# Patient Record
Sex: Male | Born: 1975 | Race: White | Hispanic: No | Marital: Single
Health system: Southern US, Community
[De-identification: ages and names within clinical notes are randomized; demographics above are authoritative.]

## PROBLEM LIST (undated history)

## (undated) DIAGNOSIS — R45851 Suicidal ideations: Secondary | ICD-10-CM

## (undated) DIAGNOSIS — I10 Essential (primary) hypertension: Secondary | ICD-10-CM

## (undated) DIAGNOSIS — F1020 Alcohol dependence, uncomplicated: Secondary | ICD-10-CM

## (undated) DIAGNOSIS — IMO0001 Reserved for inherently not codable concepts without codable children: Secondary | ICD-10-CM

---

## 2006-12-03 DIAGNOSIS — I10 Essential (primary) hypertension: Secondary | ICD-10-CM | POA: Insufficient documentation

## 2006-12-03 HISTORY — DX: Essential (primary) hypertension: I10

## 2007-12-14 ENCOUNTER — Inpatient Hospital Stay (HOSPITAL_COMMUNITY): Admission: AD | Admit: 2007-12-14 | Discharge: 2007-12-17 | Payer: Self-pay | Admitting: Psychiatry

## 2007-12-14 ENCOUNTER — Ambulatory Visit: Payer: Self-pay | Admitting: Psychiatry

## 2007-12-14 ENCOUNTER — Emergency Department (HOSPITAL_COMMUNITY): Admission: EM | Admit: 2007-12-14 | Discharge: 2007-12-14 | Payer: Self-pay | Admitting: Emergency Medicine

## 2010-09-09 ENCOUNTER — Emergency Department (HOSPITAL_COMMUNITY): Admission: EM | Admit: 2010-09-09 | Discharge: 2010-09-09 | Payer: Self-pay | Admitting: Emergency Medicine

## 2011-04-17 NOTE — H&P (Signed)
Steven Bradshaw, Steven Bradshaw NO.:  [id number]   MEDICAL RECORD NO.:  [id number]          PATIENT TYPE:  IPS   LOCATION:                                FACILITY:  [facility]   PHYSICIAN:  Geoffery Lyons, M.D.      DATE OF BIRTH:  1975/12/05   DATE OF ADMISSION:  12/14/2007  DATE OF DISCHARGE:                       PSYCHIATRIC ADMISSION ASSESSMENT   A 35 year old single male voluntarily admitted on December 14, 2007.   HISTORY OF PRESENT ILLNESS:  The patient has been drinking alcohol up to  a case or more daily.  His last drink was on Saturday.  He feels that he  may have had a seizure at home.  He states his sister told him that he  had fallen down.  He feels he had bit his tongue and was foaming at the  mouth.  He states he has also been doing cocaine, has a history of  cocaine since 1998, is able to stay away from drug use for some periods  of time and feels the alcohol is really his major issue.  He denies any  suicidal thoughts.  He has had no prior inpatient stays for detox.   PAST PSYCHIATRIC HISTORY:  First admission to [facility].  No prior admissions.  No current outpatient treatment.   SOCIAL HISTORY:  Is a 35 year old single male.  He has no children.  He  lives alone but states his sister moved in because she was having  economic problems.  He does electrical work.  He did spend 3 days in  jail in 1990 for a DUI.  Denies any current legal or any DUI charges.   FAMILY HISTORY:  Father uses alcohol.   __________ .  The patient smokes.  Again, a questionable history of seizure activity  and recent cocaine use.   PRIMARY CARE Rama Mcclintock:  Has none.   MEDICAL PROBLEMS:  It was reported he had problems with his blood  pressure, has not been on any medications and has no current medications  prior to admission.   DRUG ALLERGIES:  No known allergies.   PHYSICAL EXAM:  The patient was fully assessed at [facility] Emergency  Department.  His temperature is  97.6, heart rate 77, respirations 20,  blood pressure is 156/99, 70 inches tall.  He is 242 pounds.   His ALT is 104, AST is 48.  CBC within normal limits.  Potassium 3.4.  Alcohol level was 218.   Urine drug screen was negative.   He presents today cooperative and fully alert.  He does look flushed,  complaining of some itching.   MENTAL STATUS EXAMINATION:  Fully alert, cooperative, casually dressed,  fair eye contact.  Speech is normal pace and tone.  The patient's mood  is neutral.  The patient's affect is somewhat flat.  Thought process are  coherent.  No evidence of any thought disorder.  Cognition __________  intact.  His memory is good.  Judgment and insight are good.  Poor  impulse control.   AXIS I:  Alcohol dependence, cocaine abuse.  AXIS II:  Deferred.  AXIS III:  Hypertension.  AXIS IV:  Deferred at this time.  AXIS V:  Is 40.   PLAN:  Detox the patient with Librium protocol.  Will put patient on  seizure precautions.  We will work on relapse.  The patient may benefit  from Campral or Revia.  Case manager to assess his rehab and followup.  Will also assess comorbidities.  The patient is to follow up with a  primary care in regards to his history of hypertension.  His tentative  length of stay is 4-6 days.      Landry Corporal, N.P.      Geoffery Lyons, M.D.  Electronically Signed    JO/MEDQ  D:  12/15/2007  T:  12/16/2007  Job:  [id number]

## 2011-04-20 NOTE — Discharge Summary (Signed)
NAMEBRISTOL, SOY NO.:  [id number]   MEDICAL RECORD NO.:  [id number]          PATIENT TYPE:  IPS   LOCATION:  0507                          FACILITY:  [facility]   PHYSICIAN:  Geoffery Lyons, M.D.      DATE OF BIRTH:  [birth year]   DATE OF ADMISSION:  12/14/2007  DATE OF DISCHARGE:  12/17/2007                               DISCHARGE SUMMARY   CHIEF COMPLAINT AND PRESENT ILLNESS:  This was the first admission to  [facility] for this 35 year old male voluntarily  admitted.  Drinking alcohol up to a case or more daily.  Last drink was  on Saturday.  He feels that he might have had a seizure at home.  His  sister told him that he had fallen down.  He had bitten his tongue and  was foaming at the mouth.  Has also been doing cocaine.  History of  cocaine since 1998.  Able to stay away from drug use for some period of  time.  He feels that the alcohol is really his major issue.   PAST PSYCHIATRIC HISTORY:  [facility].  No other  treatment.   ALCOHOL AND DRUG HISTORY:  As already stated, persistent use of alcohol,  up to a case or more daily with some evidence of withdrawal as well as  some history of cocaine abuse.   MEDICAL HISTORY:  High blood pressure.   MEDICATIONS:  None.   Physical exam failed to show any acute findings.   LABORATORY WORK:  SGOT 104, SGPT 48.  CBC within normal limits.  Potassium 3.4.  Alcohol level 218.  UDS was negative for substances of  abuse.   MENTAL STATUS EXAM:  A fully alert, cooperative male, casually dressed,  fair eye contact.  Speech was normal in rate, tempo and production.  Mood is anxious.  Affect is constricted.  Thought processes are clear,  coherent and relevant.  No evidence of delusions.  No active suicidal or  homicidal ideas, no hallucinations.  Cognition:  Well preserved.   ADMISSION DIAGNOSES:  AXIS I:  1.  Alcohol dependence.  2.  Cocaine  abuse.  AXIS II:  No diagnosis.  AXIS  III:  Hypertension.  AXIS IV:  Moderate.  AXIS V:  40, highest GAF in the last year 60.   COURSE IN THE HOSPITAL:  He would he was admitted, started in individual  and group psychotherapy.  He was detoxified with Librium.  He was given  trazodone for sleep.  As already stated, a 36 year old male, single, who  endorsed difficulty with abstaining from alcohol.  Endorses that usually  drinks 18-24 beers during the work day, up to 36 if he does not have to  work.  Drinking since 1995.  Longest sober 3 days while in county jail.  Used cocaine when he was younger.  He was able to quit cocaine but he  continued to drink.  What triggered his requesting help is he is going  back on cocaine and having what seems to have been a convulsion.  Has  used benzodiazepines and shares that the benzodiazepines make him feel  violent.  He has not had any previous treatment, but his father went to  [facility] with good results and the family wants him to go there.  He has continued to work on his recovery.  He continued to work on his  recovery, going to meetings and individual sessions.  Endorsed that the  withdrawal was not as bad as he was dreading and January 14 he was in  full contact with reality.  He was committed.  He understood how  important it was for him to abstain.  A bed was available at [facility], so we went ahead and discharged to be admitted to [facility]  for residential treatment of his alcohol dependency and cocaine abuse.   DISCHARGE DIAGNOSES:  AXIS I:  1.  Alcohol dependence2.  Cocaine abuse.  3.  Substance-induced mood disorder.  AXIS II:  No diagnosis.  AXIS III:  No diagnosis.  AXIS IV:  Moderate.  AXIS V:  On discharge, 50.   Discharged on Librium 25 mg one at 1 p.m. and one at 6 p.m. on January  13.  January 14, one twice a day.  January 15, one in the morning and  then discontinue.   Follow up to [facility].      Geoffery Lyons, M.D.  Electronically  Signed     IL/MEDQ  D:  01/04/2008  T:  01/05/2008  Job:  [id number]

## 2011-08-23 LAB — DIFFERENTIAL
Basophils Absolute: 0.1
Lymphocytes Relative: 38
Monocytes Absolute: 0.8
Monocytes Relative: 8
Neutro Abs: 4.4

## 2011-08-23 LAB — HEPATIC FUNCTION PANEL
ALT: 104 — ABNORMAL HIGH
AST: 48 — ABNORMAL HIGH
Albumin: 4.3
Bilirubin, Direct: 0.2

## 2011-08-23 LAB — BASIC METABOLIC PANEL
Calcium: 9.2
GFR calc Af Amer: >60
GFR calc non Af Amer: >60
Sodium: 138

## 2011-08-23 LAB — CBC
Hemoglobin: 16.9
RBC: 4.99
RDW: 11.6

## 2011-08-23 LAB — RAPID URINE DRUG SCREEN, HOSP PERFORMED
Amphetamines: NOT DETECTED
Tetrahydrocannabinol: NOT DETECTED

## 2013-05-03 ENCOUNTER — Encounter (HOSPITAL_COMMUNITY): Payer: Self-pay

## 2013-05-03 ENCOUNTER — Emergency Department (HOSPITAL_COMMUNITY)
Admission: EM | Admit: 2013-05-03 | Discharge: 2013-05-03 | Disposition: A | Discharge status: Home / Self Care | Payer: Self-pay | Attending: Emergency Medicine | Admitting: Emergency Medicine

## 2013-05-03 DIAGNOSIS — R55 Syncope and collapse: Secondary | ICD-10-CM | POA: Insufficient documentation

## 2013-05-03 DIAGNOSIS — L299 Pruritus, unspecified: Secondary | ICD-10-CM | POA: Insufficient documentation

## 2013-05-03 DIAGNOSIS — Y9389 Activity, other specified: Secondary | ICD-10-CM | POA: Insufficient documentation

## 2013-05-03 DIAGNOSIS — R1310 Dysphagia, unspecified: Secondary | ICD-10-CM | POA: Insufficient documentation

## 2013-05-03 DIAGNOSIS — T63461A Toxic effect of venom of wasps, accidental (unintentional), initial encounter: Secondary | ICD-10-CM | POA: Insufficient documentation

## 2013-05-03 DIAGNOSIS — R221 Localized swelling, mass and lump, neck: Secondary | ICD-10-CM | POA: Insufficient documentation

## 2013-05-03 DIAGNOSIS — T6391XA Toxic effect of contact with unspecified venomous animal, accidental (unintentional), initial encounter: Secondary | ICD-10-CM | POA: Insufficient documentation

## 2013-05-03 DIAGNOSIS — R0789 Other chest pain: Secondary | ICD-10-CM | POA: Insufficient documentation

## 2013-05-03 DIAGNOSIS — Y9289 Other specified places as the place of occurrence of the external cause: Secondary | ICD-10-CM | POA: Insufficient documentation

## 2013-05-03 DIAGNOSIS — R6889 Other general symptoms and signs: Secondary | ICD-10-CM | POA: Insufficient documentation

## 2013-05-03 DIAGNOSIS — F17200 Nicotine dependence, unspecified, uncomplicated: Secondary | ICD-10-CM | POA: Insufficient documentation

## 2013-05-03 DIAGNOSIS — R0602 Shortness of breath: Secondary | ICD-10-CM | POA: Insufficient documentation

## 2013-05-03 DIAGNOSIS — R220 Localized swelling, mass and lump, head: Secondary | ICD-10-CM | POA: Insufficient documentation

## 2013-05-03 DIAGNOSIS — H5789 Other specified disorders of eye and adnexa: Secondary | ICD-10-CM | POA: Insufficient documentation

## 2013-05-03 MED ORDER — FAMOTIDINE 40 MG PO TABS: 40.0000 mg | ORAL_TABLET | Freq: Two times a day (BID) | ORAL | Status: DC

## 2013-05-03 MED ORDER — PREDNISONE 20 MG PO TABS: ORAL_TABLET | ORAL | Status: DC

## 2013-05-03 MED ORDER — EPINEPHRINE 0.3 MG/0.3ML IJ SOAJ: 0.3000 mg | INTRAMUSCULAR | Status: DC | PRN

## 2013-05-03 NOTE — ED Notes (Signed)
Pt was stung by a bee approx 40 minutes ago, had immed sob, wheezing, swelling.  Pt given 0.3 mg epi sq, 125mg  solumedrol, 50mg  zantac per ems.  Pt took own 50 mg benadryl at home prior to ems.  Pt speaking complete sentences now, denies sob

## 2013-05-03 NOTE — ED Notes (Signed)
Discharge instructions reviewed with pt, questions answered. Pt verbalized understanding.  

## 2013-05-03 NOTE — ED Provider Notes (Signed)
History  This chart was scribed for Ward Givens, MD by Bennett Scrape, ED Scribe. This patient was seen in room APA02/APA02 and the patient's care was started at 8:54 PM.  CSN: [id number]  Arrival date & time 05/03/13  2023   First MD Initiated Contact with Patient 05/03/13 2054      Chief Complaint  Patient presents with  . Anaphylactic reaction       The history is provided by the patient. No language interpreter was used.    HPI Comments: Steven Bradshaw is a 37 y.o. 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 Pt states that he developed a "burning up" sensation with his neck and lips became swollen, throat swelling, chest tightness, diffuse itching and trouble breathing following shortly after. Pt reports possible LOC which family members confirm and expand upon saying that the pt was "beet red" and he appeared to be "gasping" for air. Pt was given 3 benadryl with mild improvement and reports that he feels back to baseline after receiving 0.3 mg epi, 125 mg solumedrol and 50 mg Zantac administered by EMS en route. Family states that the pt's eyes and lips appear mildly swollen currently. Pt admits that he has experienced milder episodes with insect stings but denies any prior throat swelling. They deny cyanosis and pt denies SOB and CP currently. Family denies any family h/o allergic reactions to insect bites. Pt does not have a h/o chronic medical conditions and denies being on daily medications.  Patient reports about 7 PM he got stung and within 30 seconds he felt like he was burning up. He then had severe swelling all over including his lips, eyes, his neck, arms and in his throat. He states he felt like his throat was closing up and he was having extreme  difficulty breathing. His mother and father state he still has some mild swelling of his eyes and of his lip. While he was unconscious he was struggling to breathe.  Pt denies having a PCP currently  History reviewed. No pertinent past medical history.  History reviewed. No pertinent past surgical history.  No family history on file.  History  Substance Use Topics  . Smoking status: Current Every Day Smoker  . Smokeless tobacco: Not on file  . Alcohol Use: Yes      Review of Systems  Constitutional: Negative for fever and chills.  HENT: Positive for facial swelling and trouble swallowing.   Respiratory: Positive for chest tightness and shortness of breath. Negative for cough.   Gastrointestinal: Negative for nausea and vomiting.  Neurological: Positive for syncope. Negative for dizziness and light-headedness.  All other systems reviewed and are negative.    Allergies  Bee venom  Home Medications   Current Outpatient Rx  Name  Route  Sig  Dispense  Refill  . diphenhydrAMINE (BENADRYL) 25 mg capsule   Oral   Take 25 mg by mouth every 6 (six) hours as needed for itching or allergies.           Triage Vitals: BP 157/81  Pulse 94  Temp(Src) 97.5 F (36.4 C) (Oral)  Resp 24  Ht 5\' 10"  (1.778 m)  Wt 230 lb (104.327 kg)  BMI 33 kg/m2  SpO2 99%  Vital signs normal  Physical Exam  Nursing note and vitals reviewed. Constitutional: He is oriented to person, place, and time. He appears well-developed and well-nourished.  Non-toxic appearance. He does not appear ill. No distress.  HENT:  Head: Atraumatic.  Right Ear: External ear normal.  Left Ear: External ear normal.  Nose: Nose normal. No mucosal edema or rhinorrhea.  Mouth/Throat: Oropharynx is clear and moist and mucous membranes are normal. No dental abscesses or edematous.  Mild swelling of eyelids  Eyes: Conjunctivae and EOM are normal. Pupils are equal, round, and reactive to light.  Neck: Normal range  of motion and full passive range of motion without pain. Neck supple.  Cardiovascular: Normal rate, regular rhythm and normal heart sounds.  Exam reveals no gallop and no friction rub.   No murmur heard. Pulmonary/Chest: Effort normal and breath sounds normal. No respiratory distress. He has no wheezes. He has no rhonchi. He has no rales. He exhibits no tenderness and no crepitus.  Abdominal: Soft. Normal appearance and bowel sounds are normal. He exhibits no distension. There is no tenderness. There is no rebound and no guarding.  Musculoskeletal: Normal range of motion. He exhibits no edema and no tenderness.  Moves all extremities well.   Neurological: He is alert and oriented to person, place, and time. He has normal strength. No cranial nerve deficit.  Skin: Skin is warm, dry and intact. No rash noted. No erythema. No pallor.  Psychiatric: He has a normal mood and affect. His speech is normal and behavior is normal. His mood appears not anxious.    ED Course  Procedures (including critical care time)  DIAGNOSTIC STUDIES: Oxygen Saturation is 99% on room air, normal by my interpretation.    COORDINATION OF CARE: 9:11 PM-Discussed treatment plan which includes observation with pt at bedside and pt agreed to plan. Advised pt that he needs to carry an epi pen and call an exterminator.  10:22 PM-Pt rechecked and feels improved with medications listed above. Family states that the swelling has improved in the ED.Marland Kitchen Discussed discharge plan. Addressed symptoms to return for with pt.  Patient strongly advised to have somebody else check for any other bee or wasp next where he works and lives. He is also strongly advised to always keep an EpiPen handy.   1. Bee sting-induced anaphylaxis, initial encounter     Plan discharge  Discharge Medication List as of 05/03/2013 10:37 PM    START taking these medications   Details  EPINEPHrine (EPIPEN) 0.3 mg/0.3 mL DEVI Inject 0.3 mLs (0.3 mg total)  into the muscle as needed., Starting 05/03/2013, Until Discontinued, Print    famotidine (PEPCID) 40 MG tablet Take 1 tablet (40 mg total) by mouth 2 (two) times daily., Starting 05/03/2013, Until Discontinued, Print    predniSONE (DELTASONE) 20 MG tablet Take 3 po QD x 2d starting tomorrow, then 2 po QD x 3d then 1 po QD x 3d, Print         Devoria Albe, MD, FACEP   MDM    I personally performed the services described in this documentation, which was scribed in my presence. The recorded information has been reviewed and considered. Devoria Albe, MD, Armando Gang    Ward Givens, MD 05/03/13 [phone]

## 2013-05-03 NOTE — ED Notes (Signed)
Pt placed on monitor and 2L O2 [place], pt still feels like his face and lips are swollen but is able to breathe/swallow at this time, pt has rash on abdomen, arms and legs bilaterally.

## 2013-05-03 NOTE — ED Notes (Signed)
Pt alert & oriented x4, stable gait. Patient given discharge instructions, paperwork & prescription(s). Patient  instructed to stop at the registration desk to finish any additional paperwork. Patient verbalized understanding. Pt left department w/ no further questions. 

## 2013-06-28 LAB — ACETAMINOPHEN LEVEL: Acetaminophen: <2 ug/mL

## 2013-06-28 LAB — DRUG SCREEN, URINE
Barbiturates, Ur Screen: NEGATIVE (ref ?–200)
Benzodiazepine, Ur Scrn: NEGATIVE (ref ?–200)
Methadone, Ur Screen: NEGATIVE (ref ?–300)
Opiate, Ur Screen: NEGATIVE (ref ?–300)

## 2013-06-28 LAB — COMPREHENSIVE METABOLIC PANEL
Albumin: 3.9 g/dL (ref 3.4–5.0)
Alkaline Phosphatase: 115 U/L (ref 50–136)
Anion Gap: 12 (ref 7–16)
Chloride: 105 mmol/L (ref 98–107)
EGFR (Non-African Amer.): >60
SGOT(AST): 21 U/L (ref 15–37)
SGPT (ALT): 32 U/L (ref 12–78)
Total Protein: 7.8 g/dL (ref 6.4–8.2)

## 2013-06-28 LAB — CBC
HGB: 15.9 g/dL (ref 13.0–18.0)
RBC: 4.75 10*6/uL (ref 4.40–5.90)
WBC: 11.1 10*3/uL — ABNORMAL HIGH (ref 3.8–10.6)

## 2013-06-28 LAB — SALICYLATE LEVEL: Salicylates, Serum: 1.7 mg/dL

## 2013-06-28 LAB — ETHANOL: Ethanol %: 0.268 % — ABNORMAL HIGH (ref 0.000–0.080)

## 2013-06-29 ENCOUNTER — Inpatient Hospital Stay: Payer: Self-pay | Admitting: Psychiatry

## 2015-03-24 ENCOUNTER — Emergency Department (HOSPITAL_COMMUNITY)
Admission: EM | Admit: 2015-03-24 | Discharge: 2015-03-24 | Discharge status: Against Medical Advice | Payer: BLUE CROSS/BLUE SHIELD | Attending: Emergency Medicine | Admitting: Emergency Medicine

## 2015-03-24 ENCOUNTER — Encounter (HOSPITAL_COMMUNITY): Payer: Self-pay

## 2015-03-24 ENCOUNTER — Emergency Department (HOSPITAL_COMMUNITY): Payer: BLUE CROSS/BLUE SHIELD

## 2015-03-24 DIAGNOSIS — Z720 Tobacco use: Secondary | ICD-10-CM | POA: Diagnosis not present

## 2015-03-24 DIAGNOSIS — R079 Chest pain, unspecified: Secondary | ICD-10-CM

## 2015-03-24 DIAGNOSIS — M549 Dorsalgia, unspecified: Secondary | ICD-10-CM | POA: Insufficient documentation

## 2015-03-24 DIAGNOSIS — R0602 Shortness of breath: Secondary | ICD-10-CM | POA: Diagnosis not present

## 2015-03-24 DIAGNOSIS — Z79899 Other long term (current) drug therapy: Secondary | ICD-10-CM | POA: Insufficient documentation

## 2015-03-24 LAB — CBC WITH DIFFERENTIAL/PLATELET
Basophils Absolute: 0.1 K/uL (ref 0.0–0.1)
Basophils Relative: 1 % (ref 0–1)
Eosinophils Absolute: 0.4 K/uL (ref 0.0–0.7)
Eosinophils Relative: 6 % — ABNORMAL HIGH (ref 0–5)
HCT: 47.1 % (ref 39.0–52.0)
Hemoglobin: 17.2 g/dL — ABNORMAL HIGH (ref 13.0–17.0)
Lymphocytes Relative: 37 % (ref 12–46)
Lymphs Abs: 2.6 K/uL (ref 0.7–4.0)
MCH: 32 pg (ref 26.0–34.0)
MCHC: 36.5 g/dL — ABNORMAL HIGH (ref 30.0–36.0)
MCV: 87.7 fL (ref 78.0–100.0)
Monocytes Absolute: 1 K/uL (ref 0.1–1.0)
Monocytes Relative: 14 % — ABNORMAL HIGH (ref 3–12)
Neutro Abs: 2.9 K/uL (ref 1.7–7.7)
Neutrophils Relative %: 42 % — ABNORMAL LOW (ref 43–77)
Platelets: 235 K/uL (ref 150–400)
RBC: 5.37 MIL/uL (ref 4.22–5.81)
RDW: 11.4 % — ABNORMAL LOW (ref 11.5–15.5)
WBC: 6.9 K/uL (ref 4.0–10.5)

## 2015-03-24 LAB — BASIC METABOLIC PANEL
Anion gap: 9 (ref 5–15)
BUN: 13 mg/dL (ref 6–23)
CHLORIDE: 106 mmol/L (ref 96–112)
CO2: 25 mmol/L (ref 19–32)
Calcium: 9.6 mg/dL (ref 8.4–10.5)
Creatinine, Ser: 0.65 mg/dL (ref 0.50–1.35)
GFR calc Af Amer: >90 mL/min (ref >90)
GLUCOSE: 113 mg/dL — AB (ref 70–99)
POTASSIUM: 4.2 mmol/L (ref 3.5–5.1)
Sodium: 140 mmol/L (ref 135–145)

## 2015-03-24 LAB — TROPONIN I: Troponin I: <0.03 ng/mL

## 2015-03-24 NOTE — ED Notes (Signed)
Pt reports pt has had chest pain that started Sunday afternoon and lasted until Wednesday.  Describes as house sitting on chest.  Reports to PCP today and told them chest pain was better but still had some tightness.  PCP gave 1 nitro and tightness went away.  Reports had ekg in office and sent here for further exam.  Also reports htn, not on any medication.

## 2015-03-24 NOTE — ED Provider Notes (Signed)
CSN: [id number]     Arrival date & time 03/24/15  1053 History  This chart was scribed for Vanetta Mulders, MD by Elveria Rising, ED scribe.  This patient was seen in room APA17/APA17 and the patient's care was started at 12:14 PM.   Chief Complaint  Patient presents with  . Chest Pain   Patient is a 39 y.o. male presenting with chest pain. The history is provided by the patient. No language interpreter was used.  Chest Pain Pain quality: pressure and tightness   Pain radiates to:  Does not radiate Pain radiates to the back: no   Pain severity:  Moderate Onset quality:  Sudden Duration:  5 days Timing:  Constant Progression:  Improving Context: at rest   Relieved by:  Nitroglycerin Associated symptoms: back pain and shortness of breath   Associated symptoms: no abdominal pain, no cough, no fever, no headache, no nausea and not vomiting   Risk factors: hypertension    HPI Comments: Steven Bradshaw is a 39 y.o. male who presents to the Emergency Department as directed by doctor at clinic in town for further evaluation. Patient visited the office this morning for evaluation of constant left chest pain described as tightness and pressure with associated diaphoresis and shortness of breath ongoing for four days. Patient reports pain rated at 8/10 at onset that decreased to 5/10 for the duration of his symptoms. Patient reports resolution of pain yesterday and return of mild chest tightness this morning which was improved in the office today with 1 nitroglycerin and aspirin. EKG performed. Patient denies history of similar chest pain. Patietn reports treatment with aspirin one day following onset of his symptoms, without improvement.   History reviewed. No pertinent past medical history. History reviewed. No pertinent past surgical history. No family history on file. History  Substance Use Topics  . Smoking status: Current Every Day Smoker  . Smokeless tobacco: Not on file  . Alcohol Use:  Yes     Comment: daily, heavily- last drank etoh last night.    Review of Systems  Constitutional: Negative for fever and chills.  HENT: Negative for rhinorrhea and sore throat.   Eyes: Negative for visual disturbance.  Respiratory: Positive for shortness of breath. Negative for cough.   Cardiovascular: Positive for chest pain. Negative for leg swelling.  Gastrointestinal: Negative for nausea, vomiting and abdominal pain.  Genitourinary: Negative for dysuria and hematuria.  Musculoskeletal: Positive for back pain.  Skin: Negative for rash.  Neurological: Negative for headaches.  Hematological: Does not bruise/bleed easily.      Allergies  Bee venom  Home Medications   Prior to Admission medications   Medication Sig Start Date End Date Taking? Authorizing Provider  diphenhydrAMINE (BENADRYL) 25 mg capsule Take 25 mg by mouth every 6 (six) hours as needed for itching or allergies.   Yes Historical Provider, MD  EPINEPHrine (EPIPEN) 0.3 mg/0.3 mL DEVI Inject 0.3 mLs (0.3 mg total) into the muscle as needed. 05/03/13  Yes Devoria Albe, MD  loratadine (CLARITIN) 10 MG tablet Take 10 mg by mouth daily as needed for allergies.   Yes Historical Provider, MD  famotidine (PEPCID) 40 MG tablet Take 1 tablet (40 mg total) by mouth 2 (two) times daily. Patient not taking: Reported on 03/24/2015 05/03/13   Devoria Albe, MD  predniSONE (DELTASONE) 20 MG tablet Take 3 po QD x 2d starting tomorrow, then 2 po QD x 3d then 1 po QD x 3d Patient not taking: Reported on 03/24/2015  05/03/13   Devoria AlbeIva Knapp, MD   Triage Vitals: BP 162/100 mmHg  Pulse 97  Temp(Src) 98.4 F (36.9 C) (Oral)  Resp 18  SpO2 97% Physical Exam  Constitutional: He is oriented to person, place, and time. He appears well-developed and well-nourished. No distress.  HENT:  Head: Normocephalic and atraumatic.  Eyes: Conjunctivae and EOM are normal. Pupils are equal, round, and reactive to light.  Neck: Normal range of motion.   Cardiovascular: Normal rate, regular rhythm and normal heart sounds.   No murmur heard. Pulmonary/Chest: Effort normal and breath sounds normal.  Abdominal: Soft. Bowel sounds are normal. There is no tenderness.  Musculoskeletal: Normal range of motion.  Neurological: He is alert and oriented to person, place, and time. No cranial nerve deficit. He exhibits normal muscle tone. Coordination normal.  Skin: Skin is warm. No rash noted.  Nursing note and vitals reviewed.   ED Course  Procedures (including critical care time)  COORDINATION OF CARE: 12:14 PM- Patient informed of protocol for admission and observation. Patient states "No, I have to go. I have things to do." Patient informed that his leaving is against medical advise, but he maintains his decision.  Discussed treatment plan with patient at bedside and patient agreed to plan.   Labs Review Labs Reviewed  CBC WITH DIFFERENTIAL/PLATELET - Abnormal; Notable for the following:    Hemoglobin 17.2 (*)    MCHC 36.5 (*)    RDW 11.4 (*)    Neutrophils Relative % 42 (*)    Monocytes Relative 14 (*)    Eosinophils Relative 6 (*)    All other components within normal limits  BASIC METABOLIC PANEL - Abnormal; Notable for the following:    Glucose, Bld 113 (*)    All other components within normal limits  TROPONIN I   Results for orders placed or performed during the hospital encounter of 03/24/15  CBC with Differential  Result Value Ref Range   WBC 6.9 4.0 - 10.5 K/uL   RBC 5.37 4.22 - 5.81 MIL/uL   Hemoglobin 17.2 (H) 13.0 - 17.0 g/dL   HCT 16.147.1 09.639.0 - 04.552.0 %   MCV 87.7 78.0 - 100.0 fL   MCH 32.0 26.0 - 34.0 pg   MCHC 36.5 (H) 30.0 - 36.0 g/dL   RDW 40.911.4 (L) 81.111.5 - 91.415.5 %   Platelets 235 150 - 400 K/uL   Neutrophils Relative % 42 (L) 43 - 77 %   Neutro Abs 2.9 1.7 - 7.7 K/uL   Lymphocytes Relative 37 12 - 46 %   Lymphs Abs 2.6 0.7 - 4.0 K/uL   Monocytes Relative 14 (H) 3 - 12 %   Monocytes Absolute 1.0 0.1 - 1.0 K/uL    Eosinophils Relative 6 (H) 0 - 5 %   Eosinophils Absolute 0.4 0.0 - 0.7 K/uL   Basophils Relative 1 0 - 1 %   Basophils Absolute 0.1 0.0 - 0.1 K/uL  Basic metabolic panel  Result Value Ref Range   Sodium 140 135 - 145 mmol/L   Potassium 4.2 3.5 - 5.1 mmol/L   Chloride 106 96 - 112 mmol/L   CO2 25 19 - 32 mmol/L   Glucose, Bld 113 (H) 70 - 99 mg/dL   BUN 13 6 - 23 mg/dL   Creatinine, Ser 7.820.65 0.50 - 1.35 mg/dL   Calcium 9.6 8.4 - 95.610.5 mg/dL   GFR calc non Af Amer >90 >90 mL/min   GFR calc Af Amer >90 >90 mL/min  Anion gap 9 5 - 15  Troponin I  Result Value Ref Range   Troponin I <0.03 <0.031 ng/mL     Imaging Review Dg Chest Portable 1 View  03/24/2015   CLINICAL DATA:  Acute chest pain.  EXAM: PORTABLE CHEST - 1 VIEW  COMPARISON:  December 14, 2007.  FINDINGS: The heart size and mediastinal contours are within normal limits. Both lungs are clear. No pneumothorax or pleural effusion is noted. The visualized skeletal structures are unremarkable.  IMPRESSION: No acute cardiopulmonary abnormality seen.   Electronically Signed   By: Lupita Raider, M.D.   On: 03/24/2015 11:37     EKG Interpretation   Date/Time:  Thursday March 24 2015 11:03:13 EDT Ventricular Rate:  101 PR Interval:  133 QRS Duration: 92 QT Interval:  324 QTC Calculation: 420 R Axis:   66 Text Interpretation:  Sinus tachycardia Probable left atrial enlargement  Baseline wander in lead(s) V1 no previousl  artifact Confirmed by  Gracie Gupta  MD, Nathalee Smarr ([id number]) on 03/24/2015 11:07:53 AM      MDM   Final diagnoses:  Chest pain, unspecified chest pain type    patient with concerning history for cardiac type chest pain. Patient with pain since Sunday but still had pain this morning went away with the nitroglycerin and aspirin at outlying doctor's office. Patient refuses admission. Was able to reassure patient that probably did not have a cardiac heart attack yesterday or earlier in the week but we cannot say that  this is not a warning sign and cannot say what happened today. Recommended admission for cardiac rule out. Patient refuses but understands the risks to include death.    workup otherwise negative chest x-ray negative for pneumonia or pulmonary edema. No significant lab abnormalities. First troponin negative as stated. Patient states he will at least follow-up with cardiology and continue an aspirin a day.  I personally performed the services described in this documentation, which was scribed in my presence. The recorded information has been reviewed and is accurate.   Vanetta Mulders, MD 03/24/15 1319

## 2015-03-24 NOTE — ED Notes (Signed)
MD states pt is going to leave AMA. Pt is refusing to be admitted.

## 2015-03-24 NOTE — Discharge Instructions (Signed)
As stated we recommend admission. Return for any new or worse symptoms. At least follow-up with cardiology. Continue take a baby aspirin a day. Return for any recurrent chest pain at all. He can return at any point in time if you change your mind.

## 2015-03-24 NOTE — ED Notes (Signed)
MD at bedside. 

## 2015-03-25 NOTE — H&P (Signed)
PATIENT NAME:  Steven Bradshaw, Steven Bradshaw MR#:  [id number] DATE OF BIRTH:  09-14-1976  DATE OF ADMISSION:  06/29/2013  REFERRING PHYSICIAN: Emergency Room M.D.   ATTENDING PHYSICIAN: Keeya Dyckman B. Jennet Maduro, M.D.   IDENTIFYING DATA: Mr. Luczak is a 39 year old male with a history of alcoholism.   CHIEF COMPLAINT: "I need treatment."   HISTORY OF PRESENT ILLNESS: Mr. Barrack has been drinking since the age of 1. He lives with his mother and father now and they do not allow him to drink at home. However this weekend they went out of town and he got drunk. Usually he drinks after work in the evening and drinks until he passes out. He got so sick this weekend that he decided to come for alcohol treatment. He denies any symptoms of depression or psychosis. There are no symptoms of bipolar mania. He denies other than alcohol substance use. He does anxious, especially in a social setting. He tried to go to AA in the past but it has been very difficult for him to put up with the crowd.   PAST PSYCHIATRIC HISTORY: As above, he started drinking at 23. In 2008 he went to [facility] for a 30-day program. He did not maintain any sobriety after. He went to 1 AA meeting. He explains that at that time it was his family telling him what to do, and he was not committed. He feels that he has the resolve now.   FAMILY PSYCHIATRIC HISTORY: Multiple family members with alcoholism.   PAST MEDICAL HISTORY: None.   ALLERGIES: No known drug allergies.   MEDICATIONS ON ADMISSION: None.   SOCIAL HISTORY: He works as an Personnel officer. Twice already he quit to his job at the same company after 10 years of working there due to alcohol. He is a Marketing executive and always welcome back. He has never been married, and has no children. He currently lives with his mother and father.   REVIEW OF SYSTEMS:  CONSTITUTIONAL: No fevers or chills. No weight changes.  EYES: No double or blurred vision.  ENT: No hearing loss.  RESPIRATORY:  No shortness of breath or cough.  CARDIOVASCULAR: No chest pain or orthopnea.  GASTROINTESTINAL: No abdominal pain, nausea, vomiting, or diarrhea.  GENITOURINARY: No incontinence or frequency.  ENDOCRINE: No heat or cold intolerance.  LYMPHATIC: No anemia or easy bruising.  INTEGUMENTARY: No acne or rash.  MUSCULOSKELETAL: No muscle or joint pain.  NEUROLOGIC: No tingling or weakness.  PSYCHIATRIC: See history of present illness for details.   PHYSICAL EXAMINATION: VITAL SIGNS: Blood pressure 145/87, pulse 96, respirations 20, temperature 98.1.  GENERAL: This is a slightly obese male in no acute distress.  HEENT: The pupils are equal, round, and reactive to light. Sclerae are anicteric.  NECK: Supple. No thyromegaly.  LUNGS: Clear to auscultation. No dullness to percussion.  HEART: Regular rhythm and rate. No murmurs, rubs, or gallops.  ABDOMEN: Soft, nontender, nondistended. Positive bowel sounds.  MUSCULOSKELETAL: Normal muscle strength in all extremities.  SKIN: No rashes or bruises.  LYMPHATIC: No cervical adenopathy.  NEUROLOGIC: Cranial nerves II-XII are intact.   LABORATORY DATA: Chemistries are within normal limits.   Blood alcohol level is 0.268.   LFTs are within normal limits. TSH 1.66.   Urine tox screen negative for substances.   CBC within normal limits except for white blood count of 11.1. Serum acetaminophen and salicylates are low.   MENTAL STATUS EXAMINATION: On admission the patient is alert and oriented to person, place, time, and  situation. He is pleasant, polite, and cooperative. He is well-groomed and casually dressed. He maintains good eye contact. His speech is of normal rhythm, rate and volume. Mood is fine, with flat affect. Thought processing is logical and goal-oriented. Thought content: The patient denies suicidal or homicidal ideations. There are no delusions or paranoia. There are no auditory or visual hallucinations. His cognition is grossly intact.  He registers 3 out of 3 and recalls 3 out of 3 objects after 5 minutes. He can spell "world" forwards and backwards. He knows the current Economistresident. His insight and judgment are questionable.   Suicide risk assessment on admission: This is a patient with a lifelong history of alcoholism and mild anxiety, but not depression or suicidal ideation, who came to the hospital asking for alcohol treatment.   INITIAL DIAGNOSES:   AXIS I: Alcohol dependence.   AXIS II: Deferred.   AXIS III: Deferred.   AXIS IV: Substance abuse, family conflict, legal.   AXIS V: Global Assessment Of Functioning: On admission 35.   PLAN: The patient was admitted to the [facility] Unit for safety, stabilization and medication management. He was initially placed on suicide precautions and was closely monitored for any unsafe behaviors. He underwent full psychiatric and risk assessment. He received pharmacotherapy, individual and group psychotherapy, substance abuse counseling, and support from therapeutic milieu.   1.  Alcohol detox: He is placed on the CIWA protocol. Will be monitored for symptoms of alcohol withdrawal.  2.  Substance abuse treatment: The patient has private insurance. We will contact his insurance company to see what they recommendation.   DISPOSITION: He will be discharged to home.    ____________________________ Ellin GoodieJolanta B. Jaysion Ramseyer, MD jbp:dm D: 06/29/2013 13:55:00 ET T: 06/29/2013 14:52:17 ET JOB#: [id number]  cc: Dorris Pierre B. Jennet MaduroPucilowska, MD, <Dictator> Shari ProwsJOLANTA B Cordelle Dahmen MD ELECTRONICALLY SIGNED 06/29/2013 21:41

## 2015-04-03 DIAGNOSIS — IMO0001 Reserved for inherently not codable concepts without codable children: Secondary | ICD-10-CM

## 2015-04-03 HISTORY — DX: Reserved for inherently not codable concepts without codable children: IMO0001

## 2015-04-14 ENCOUNTER — Observation Stay (HOSPITAL_COMMUNITY): Payer: BLUE CROSS/BLUE SHIELD

## 2015-04-14 ENCOUNTER — Observation Stay (HOSPITAL_BASED_OUTPATIENT_CLINIC_OR_DEPARTMENT_OTHER): Payer: BLUE CROSS/BLUE SHIELD

## 2015-04-14 ENCOUNTER — Observation Stay (HOSPITAL_COMMUNITY)
Admission: EM | Admit: 2015-04-14 | Discharge: 2015-04-15 | Disposition: A | Discharge status: Home / Self Care | Payer: BLUE CROSS/BLUE SHIELD | Attending: Internal Medicine | Admitting: Internal Medicine

## 2015-04-14 ENCOUNTER — Encounter (HOSPITAL_COMMUNITY): Payer: Self-pay

## 2015-04-14 ENCOUNTER — Other Ambulatory Visit (HOSPITAL_COMMUNITY): Payer: Self-pay

## 2015-04-14 DIAGNOSIS — F17210 Nicotine dependence, cigarettes, uncomplicated: Secondary | ICD-10-CM | POA: Diagnosis not present

## 2015-04-14 DIAGNOSIS — T7840XA Allergy, unspecified, initial encounter: Principal | ICD-10-CM

## 2015-04-14 DIAGNOSIS — R079 Chest pain, unspecified: Secondary | ICD-10-CM | POA: Diagnosis not present

## 2015-04-14 DIAGNOSIS — Z79899 Other long term (current) drug therapy: Secondary | ICD-10-CM | POA: Insufficient documentation

## 2015-04-14 DIAGNOSIS — L509 Urticaria, unspecified: Secondary | ICD-10-CM | POA: Diagnosis present

## 2015-04-14 DIAGNOSIS — Z91030 Bee allergy status: Secondary | ICD-10-CM | POA: Diagnosis not present

## 2015-04-14 DIAGNOSIS — X58XXXA Exposure to other specified factors, initial encounter: Secondary | ICD-10-CM | POA: Diagnosis not present

## 2015-04-14 DIAGNOSIS — R072 Precordial pain: Secondary | ICD-10-CM

## 2015-04-14 DIAGNOSIS — R0602 Shortness of breath: Secondary | ICD-10-CM

## 2015-04-14 DIAGNOSIS — F1010 Alcohol abuse, uncomplicated: Secondary | ICD-10-CM | POA: Diagnosis present

## 2015-04-14 DIAGNOSIS — Z720 Tobacco use: Secondary | ICD-10-CM | POA: Diagnosis present

## 2015-04-14 DIAGNOSIS — I10 Essential (primary) hypertension: Secondary | ICD-10-CM

## 2015-04-14 HISTORY — DX: Essential (primary) hypertension: I10

## 2015-04-14 LAB — TROPONIN I
Troponin I: <0.03 ng/mL (ref <0.031)
Troponin I: <0.03 ng/mL (ref <0.031)
Troponin I: <0.03 ng/mL (ref <0.031)

## 2015-04-14 LAB — RAPID URINE DRUG SCREEN, HOSP PERFORMED
Amphetamines: NOT DETECTED
Barbiturates: NOT DETECTED
Benzodiazepines: NOT DETECTED
Cocaine: NOT DETECTED
OPIATES: NOT DETECTED
TETRAHYDROCANNABINOL: NOT DETECTED

## 2015-04-14 LAB — CBC WITH DIFFERENTIAL/PLATELET
Basophils Absolute: 0 10*3/uL (ref 0.0–0.1)
Basophils Relative: 0 % (ref 0–1)
EOS PCT: 3 % (ref 0–5)
Eosinophils Absolute: 0.2 10*3/uL (ref 0.0–0.7)
HEMATOCRIT: 47.1 % (ref 39.0–52.0)
HEMOGLOBIN: 17.1 g/dL — AB (ref 13.0–17.0)
LYMPHS PCT: 29 % (ref 12–46)
Lymphs Abs: 1.9 10*3/uL (ref 0.7–4.0)
MCH: 31.5 pg (ref 26.0–34.0)
MCHC: 36.3 g/dL — ABNORMAL HIGH (ref 30.0–36.0)
MCV: 86.9 fL (ref 78.0–100.0)
MONO ABS: 0.9 10*3/uL (ref 0.1–1.0)
MONOS PCT: 13 % — AB (ref 3–12)
NEUTROS ABS: 3.7 10*3/uL (ref 1.7–7.7)
Neutrophils Relative %: 55 % (ref 43–77)
Platelets: 225 10*3/uL (ref 150–400)
RBC: 5.42 MIL/uL (ref 4.22–5.81)
RDW: 11.6 % (ref 11.5–15.5)
WBC: 6.7 10*3/uL (ref 4.0–10.5)

## 2015-04-14 LAB — BASIC METABOLIC PANEL
ANION GAP: 5 (ref 5–15)
BUN: 12 mg/dL (ref 6–20)
CALCIUM: 9.6 mg/dL (ref 8.9–10.3)
CHLORIDE: 108 mmol/L (ref 101–111)
CO2: 25 mmol/L (ref 22–32)
CREATININE: 0.7 mg/dL (ref 0.61–1.24)
GFR calc non Af Amer: >60 mL/min (ref >60)
Glucose, Bld: 120 mg/dL — ABNORMAL HIGH (ref 65–99)
Potassium: 4.5 mmol/L (ref 3.5–5.1)
Sodium: 138 mmol/L (ref 135–145)

## 2015-04-14 LAB — CREATININE, SERUM
Creatinine, Ser: 0.66 mg/dL (ref 0.61–1.24)
GFR calc non Af Amer: >60 mL/min (ref >60)

## 2015-04-14 LAB — LIPID PANEL
CHOL/HDL RATIO: 2.8 ratio
Cholesterol: 156 mg/dL (ref 0–200)
HDL: 56 mg/dL (ref >40)
LDL Cholesterol: 93 mg/dL (ref 0–99)
Triglycerides: 37 mg/dL (ref <150)
VLDL: 7 mg/dL (ref 0–40)

## 2015-04-14 LAB — CBC
HEMATOCRIT: 48.8 % (ref 39.0–52.0)
Hemoglobin: 17.9 g/dL — ABNORMAL HIGH (ref 13.0–17.0)
MCH: 31.7 pg (ref 26.0–34.0)
MCHC: 36.7 g/dL — ABNORMAL HIGH (ref 30.0–36.0)
MCV: 86.4 fL (ref 78.0–100.0)
PLATELETS: 244 10*3/uL (ref 150–400)
RBC: 5.65 MIL/uL (ref 4.22–5.81)
RDW: 11.6 % (ref 11.5–15.5)
WBC: 8.9 10*3/uL (ref 4.0–10.5)

## 2015-04-14 LAB — MRSA PCR SCREENING: MRSA by PCR: NEGATIVE

## 2015-04-14 LAB — TSH: TSH: 0.274 u[IU]/mL — ABNORMAL LOW (ref 0.350–4.500)

## 2015-04-14 LAB — D-DIMER, QUANTITATIVE: D-Dimer, Quant: 1.58 ug/mL-FEU — ABNORMAL HIGH (ref 0.00–0.48)

## 2015-04-14 LAB — ETHANOL: Alcohol, Ethyl (B): <5 mg/dL (ref <5)

## 2015-04-14 MED ORDER — REGADENOSON 0.4 MG/5ML IV SOLN
INTRAVENOUS | Status: AC
  Filled 2015-04-14: qty 5

## 2015-04-14 MED ORDER — DIPHENHYDRAMINE HCL 50 MG/ML IJ SOLN
25.0000 mg | Freq: Once | INTRAMUSCULAR | Status: AC
  Administered 2015-04-14: 25 mg via INTRAVENOUS
  Filled 2015-04-14: qty 1

## 2015-04-14 MED ORDER — NICOTINE 14 MG/24HR TD PT24
14.0000 mg | MEDICATED_PATCH | Freq: Every day | TRANSDERMAL | Status: DC
  Administered 2015-04-14 – 2015-04-15 (×2): 14 mg via TRANSDERMAL
  Filled 2015-04-14 (×2): qty 1

## 2015-04-14 MED ORDER — MORPHINE SULFATE 2 MG/ML IJ SOLN: 2.0000 mg | INTRAMUSCULAR | Status: DC | PRN

## 2015-04-14 MED ORDER — SODIUM CHLORIDE 0.9 % IJ SOLN
3.0000 mL | Freq: Two times a day (BID) | INTRAMUSCULAR | Status: DC
  Administered 2015-04-14 – 2015-04-15 (×3): 3 mL via INTRAVENOUS

## 2015-04-14 MED ORDER — VITAMIN B-1 100 MG PO TABS
100.0000 mg | ORAL_TABLET | Freq: Every day | ORAL | Status: DC
  Administered 2015-04-14 – 2015-04-15 (×2): 100 mg via ORAL
  Filled 2015-04-14 (×2): qty 1

## 2015-04-14 MED ORDER — ASPIRIN 81 MG PO CHEW: 324.0000 mg | CHEWABLE_TABLET | Freq: Once | ORAL | Status: DC

## 2015-04-14 MED ORDER — ADULT MULTIVITAMIN W/MINERALS CH
1.0000 | ORAL_TABLET | Freq: Every day | ORAL | Status: DC
  Administered 2015-04-14 – 2015-04-15 (×2): 1 via ORAL
  Filled 2015-04-14 (×2): qty 1

## 2015-04-14 MED ORDER — ACETAMINOPHEN 325 MG PO TABS: 650.0000 mg | ORAL_TABLET | Freq: Four times a day (QID) | ORAL | Status: DC | PRN

## 2015-04-14 MED ORDER — ONDANSETRON HCL 4 MG PO TABS: 4.0000 mg | ORAL_TABLET | Freq: Four times a day (QID) | ORAL | Status: DC | PRN

## 2015-04-14 MED ORDER — LORAZEPAM 0.5 MG PO TABS
1.0000 mg | ORAL_TABLET | Freq: Four times a day (QID) | ORAL | Status: DC | PRN
  Administered 2015-04-14: 1 mg via ORAL
  Filled 2015-04-14: qty 2

## 2015-04-14 MED ORDER — METOPROLOL TARTRATE 25 MG PO TABS
25.0000 mg | ORAL_TABLET | Freq: Two times a day (BID) | ORAL | Status: DC
Start: 2015-04-14 — End: 2015-04-15
  Administered 2015-04-14 – 2015-04-15 (×3): 25 mg via ORAL
  Filled 2015-04-14 (×3): qty 1

## 2015-04-14 MED ORDER — ONDANSETRON HCL 4 MG/2ML IJ SOLN: 4.0000 mg | Freq: Four times a day (QID) | INTRAMUSCULAR | Status: DC | PRN

## 2015-04-14 MED ORDER — ACETAMINOPHEN 650 MG RE SUPP: 650.0000 mg | Freq: Four times a day (QID) | RECTAL | Status: DC | PRN

## 2015-04-14 MED ORDER — METHYLPREDNISOLONE SODIUM SUCC 125 MG IJ SOLR
125.0000 mg | Freq: Once | INTRAMUSCULAR | Status: AC
  Administered 2015-04-14: 125 mg via INTRAVENOUS
  Filled 2015-04-14: qty 2

## 2015-04-14 MED ORDER — PREDNISONE 20 MG PO TABS
40.0000 mg | ORAL_TABLET | Freq: Every day | ORAL | Status: DC
  Administered 2015-04-15: 40 mg via ORAL
  Filled 2015-04-14: qty 2

## 2015-04-14 MED ORDER — TECHNETIUM TC 99M SESTAMIBI GENERIC - CARDIOLITE
10.0000 | Freq: Once | INTRAVENOUS | Status: AC | PRN
  Administered 2015-04-14: 10 via INTRAVENOUS

## 2015-04-14 MED ORDER — SODIUM CHLORIDE 0.9 % IJ SOLN
10.0000 mL | INTRAMUSCULAR | Status: DC | PRN
  Administered 2015-04-14: 10 mL via INTRAVENOUS
  Filled 2015-04-14: qty 10

## 2015-04-14 MED ORDER — MORPHINE SULFATE 4 MG/ML IJ SOLN
4.0000 mg | INTRAMUSCULAR | Status: DC | PRN
  Filled 2015-04-14: qty 1

## 2015-04-14 MED ORDER — NITROGLYCERIN 0.4 MG SL SUBL: 0.4000 mg | SUBLINGUAL_TABLET | SUBLINGUAL | Status: DC | PRN

## 2015-04-14 MED ORDER — THIAMINE HCL 100 MG/ML IJ SOLN: 100.0000 mg | Freq: Every day | INTRAMUSCULAR | Status: DC

## 2015-04-14 MED ORDER — ONDANSETRON HCL 4 MG/2ML IJ SOLN
4.0000 mg | Freq: Once | INTRAMUSCULAR | Status: AC
  Administered 2015-04-14: 4 mg via INTRAVENOUS
  Filled 2015-04-14: qty 2

## 2015-04-14 MED ORDER — TECHNETIUM TC 99M SESTAMIBI - CARDIOLITE
30.0000 | Freq: Once | INTRAVENOUS | Status: AC | PRN
  Administered 2015-04-14: 30 via INTRAVENOUS

## 2015-04-14 MED ORDER — SODIUM CHLORIDE 0.9 % IJ SOLN
INTRAMUSCULAR | Status: AC
  Administered 2015-04-14: 10 mL via INTRAVENOUS
  Filled 2015-04-14: qty 3

## 2015-04-14 MED ORDER — ASPIRIN EC 325 MG PO TBEC
325.0000 mg | DELAYED_RELEASE_TABLET | Freq: Every day | ORAL | Status: DC
Start: 2015-04-14 — End: 2015-04-15
  Administered 2015-04-14 – 2015-04-15 (×2): 325 mg via ORAL
  Filled 2015-04-14 (×2): qty 1

## 2015-04-14 MED ORDER — FOLIC ACID 1 MG PO TABS
1.0000 mg | ORAL_TABLET | Freq: Every day | ORAL | Status: DC
  Administered 2015-04-14 – 2015-04-15 (×2): 1 mg via ORAL
  Filled 2015-04-14 (×2): qty 1

## 2015-04-14 MED ORDER — LORAZEPAM 2 MG/ML IJ SOLN: 1.0000 mg | Freq: Four times a day (QID) | INTRAMUSCULAR | Status: DC | PRN

## 2015-04-14 MED ORDER — HEPARIN SODIUM (PORCINE) 5000 UNIT/ML IJ SOLN
5000.0000 [IU] | Freq: Three times a day (TID) | INTRAMUSCULAR | Status: DC
  Administered 2015-04-14 – 2015-04-15 (×3): 5000 [IU] via SUBCUTANEOUS
  Filled 2015-04-14 (×3): qty 1

## 2015-04-14 MED ORDER — FAMOTIDINE IN NACL 20-0.9 MG/50ML-% IV SOLN
20.0000 mg | Freq: Once | INTRAVENOUS | Status: AC
  Administered 2015-04-14: 20 mg via INTRAVENOUS
  Filled 2015-04-14: qty 50

## 2015-04-14 NOTE — Consult Note (Signed)
Reason for Consult:   Chest pain  Requesting Physician: Dr Rhona Leavenshiu Primary Cardiologist New  HPI: This is a 39 y.o. male with a past medical history significant for HTN, 2ppd smoking, and daily ETIH. He works as an Personnel officerelectrician. He was seen in the ED 03/24/15 with chest pain and ruled out for an MI. He left the ED, declined admission. The records indicate his symptoms were relieved with NTG then. He says he did well this week till this am when he was awakened with SSCP "like a house on my chest". He denies any diaphoresis, or radiation to his arms or jaw. He di have some N/V. He also had a diffuse rash. In ED he was treated with steroids IV, ASA, and Pepcid.  His Troponin have been negative x 2. His EKG showed no acute changes. He tells me he continues to have waxing and waning chest pain.   PMHx:  Past Medical History  Diagnosis Date  . Hypertension     History reviewed. No pertinent past surgical history.  SOCHx:  reports that he has been smoking.  He does not have any smokeless tobacco history on file. He reports that he drinks alcohol. He reports that he does not use illicit drugs.  FAMHx: History reviewed. No pertinent family history.  ALLERGIES: Allergies  Allergen Reactions  . Bee Venom Anaphylaxis    ROS: Pertinent items are noted in HPI. see H&P for complete ROS  HOME MEDICATIONS: Prior to Admission medications   Medication Sig Start Date End Date Taking? Authorizing Provider  atenolol-chlorthalidone (TENORETIC) 100-25 MG per tablet Take 1 tablet by mouth daily.   Yes Historical Provider, MD  EPINEPHrine (EPIPEN) 0.3 mg/0.3 mL DEVI Inject 0.3 mLs (0.3 mg total) into the muscle as needed. 05/03/13  Yes Devoria AlbeIva Knapp, MD  loratadine (CLARITIN) 10 MG tablet Take 10 mg by mouth daily as needed for allergies.   Yes Historical Provider, MD  diphenhydrAMINE (BENADRYL) 25 mg capsule Take 25 mg by mouth every 6 (six) hours as needed for itching or allergies.     Historical Provider, MD  famotidine (PEPCID) 40 MG tablet Take 1 tablet (40 mg total) by mouth 2 (two) times daily. Patient not taking: Reported on 03/24/2015 05/03/13   Devoria AlbeIva Knapp, MD  predniSONE (DELTASONE) 20 MG tablet Take 3 po QD x 2d starting tomorrow, then 2 po QD x 3d then 1 po QD x 3d Patient not taking: Reported on 03/24/2015 05/03/13   Devoria AlbeIva Knapp, MD    HOSPITAL MEDICATIONS: I have reviewed the patient's current medications.  VITALS: Blood pressure 134/84, pulse 104, temperature 97.6 F (36.4 C), temperature source Oral, resp. rate 20, height 5\' 10"  (1.778 m), weight 211 lb 10.3 oz (96 kg), SpO2 98 %.  PHYSICAL EXAM: General appearance: alert, cooperative, no distress and anxious, tense Neck: no carotid bruit and no JVD Lungs: clear to auscultation bilaterally Heart: regular rate and rhythm, S1, S2 normal, no murmur, click, rub or gallop Abdomen: soft, non-tender; bowel sounds normal; no masses,  no organomegaly Extremities: extremities normal, atraumatic, no cyanosis or edema Pulses: 2+ and symmetric Skin: Skin color, texture, turgor normal. No rashes or lesions Neurologic: Grossly normal  LABS: Results for orders placed or performed during the hospital encounter of 04/14/15 (from the past 24 hour(s))  CBC with Differential/Platelet     Status: Abnormal   Collection Time: 04/14/15  6:28 AM  Result Value Ref Range   WBC 6.7 4.0 -  10.5 K/uL   RBC 5.42 4.22 - 5.81 MIL/uL   Hemoglobin 17.1 (H) 13.0 - 17.0 g/dL   HCT 16.147.1 09.639.0 - 04.552.0 %   MCV 86.9 78.0 - 100.0 fL   MCH 31.5 26.0 - 34.0 pg   MCHC 36.3 (H) 30.0 - 36.0 g/dL   RDW 40.911.6 81.111.5 - 91.415.5 %   Platelets 225 150 - 400 K/uL   Neutrophils Relative % 55 43 - 77 %   Neutro Abs 3.7 1.7 - 7.7 K/uL   Lymphocytes Relative 29 12 - 46 %   Lymphs Abs 1.9 0.7 - 4.0 K/uL   Monocytes Relative 13 (H) 3 - 12 %   Monocytes Absolute 0.9 0.1 - 1.0 K/uL   Eosinophils Relative 3 0 - 5 %   Eosinophils Absolute 0.2 0.0 - 0.7 K/uL   Basophils  Relative 0 0 - 1 %   Basophils Absolute 0.0 0.0 - 0.1 K/uL  Basic metabolic panel     Status: Abnormal   Collection Time: 04/14/15  6:28 AM  Result Value Ref Range   Sodium 138 135 - 145 mmol/L   Potassium 4.5 3.5 - 5.1 mmol/L   Chloride 108 101 - 111 mmol/L   CO2 25 22 - 32 mmol/L   Glucose, Bld 120 (H) 65 - 99 mg/dL   BUN 12 6 - 20 mg/dL   Creatinine, Ser 7.820.70 0.61 - 1.24 mg/dL   Calcium 9.6 8.9 - 95.610.3 mg/dL   GFR calc non Af Amer >60 >60 mL/min   GFR calc Af Amer >60 >60 mL/min   Anion gap 5 5 - 15  Troponin I     Status: None   Collection Time: 04/14/15  6:28 AM  Result Value Ref Range   Troponin I <0.03 <0.031 ng/mL  Creatinine, serum     Status: None   Collection Time: 04/14/15  9:30 AM  Result Value Ref Range   Creatinine, Ser 0.66 0.61 - 1.24 mg/dL   GFR calc non Af Amer >60 >60 mL/min   GFR calc Af Amer >60 >60 mL/min  Lipid panel     Status: None   Collection Time: 04/14/15  9:30 AM  Result Value Ref Range   Cholesterol 156 0 - 200 mg/dL   Triglycerides 37 <213<150 mg/dL   HDL 56 >08>40 mg/dL   Total CHOL/HDL Ratio 2.8 RATIO   VLDL 7 0 - 40 mg/dL   LDL Cholesterol 93 0 - 99 mg/dL  Troponin I     Status: None   Collection Time: 04/14/15  9:30 AM  Result Value Ref Range   Troponin I <0.03 <0.031 ng/mL  Ethanol     Status: None   Collection Time: 04/14/15  9:39 AM  Result Value Ref Range   Alcohol, Ethyl (B) <5 <5 mg/dL  CBC     Status: Abnormal   Collection Time: 04/14/15  9:44 AM  Result Value Ref Range   WBC 8.9 4.0 - 10.5 K/uL   RBC 5.65 4.22 - 5.81 MIL/uL   Hemoglobin 17.9 (H) 13.0 - 17.0 g/dL   HCT 65.748.8 84.639.0 - 96.252.0 %   MCV 86.4 78.0 - 100.0 fL   MCH 31.7 26.0 - 34.0 pg   MCHC 36.7 (H) 30.0 - 36.0 g/dL   RDW 95.211.6 84.111.5 - 32.415.5 %   Platelets 244 150 - 400 K/uL    EKG: NSR, ST, no acute changes  IMAGING: CXR 03/24/15 NAD   IMPRESSION: Principal Problem:   Chest pain with  moderate risk of acute coronary syndrome Active Problems:   Allergic reaction    Essential hypertension   Tobacco abuse   ETOH abuse   RECOMMENDATION: Worrisome symptoms but Troponin negative x 2 and EKG without acute changes. Will re check EKG. MD to see. Will keep NPO for now.   Time Spent Directly with Patient: 40 minutes  Abelino Derrick [phone] beeper 04/14/2015, 10:34 AM   The patient was seen and examined, and I agree with the assessment and plan as documented above, with modifications as noted below. Pt with aforementioned history admitted with chest pain. Troponins normal thus far and pain began on 5/11. ECG without ischemic changes. Drinks 12-18 beers daily and smokes 2 ppd x 20 years. Chest pain relieved with SL nitroglycerin. Intermediate pretest probability for CAD. Will obtain exercise Cardiolite for further clarification. Consider addition of Imdur 30 mg. Will await echocardiogram results as well.

## 2015-04-14 NOTE — ED Notes (Signed)
Awaiting aspirin from pharmacy as ER is out of stock.

## 2015-04-14 NOTE — ED Notes (Signed)
   04/14/15 0550  Chest Pain Assessment  Occurrence Today  Chronicity New  Chest Pain Location Central chest  Pain Descriptors / Indicators Pressure  Associated Symptoms Shortness of breath;Other (Comment) (hives)  Pt c/o chest pressure and rash all over body. Pt states he started a new bp med over two weeks ago but nothing else has changed. Pt states the pain started around 0300. Pt has visible rash to trunk of body.

## 2015-04-14 NOTE — ED Notes (Signed)
Pt states he awoke with chest pressure, itching, rash, sob,  States he started a new blood pressure med a few weeks ago but cannot remember the name of the med.

## 2015-04-14 NOTE — ED Provider Notes (Signed)
CSN: [id number]     Arrival date & time 04/14/15  0536 History   First MD Initiated Contact with Patient 04/14/15 0544     Chief Complaint  Patient presents with  . Chest Pain  . Allergic Reaction      HPI  Patient presents with rash, chest pain, and vomiting. He went to bed last night without symptoms. He awakened at 03 100 this morning with "hives" on his arms and chest itching. Associated nausea with 2 episodes of vomiting and chest pain. Denies shortness of breath. No recent illness. No new antigens or exposures. Previous history of allergic reaction to "dust", and with a bee sting at one point as well. No new foods. He is on a new blood pressure medicine for 3 weeks.  [place]CP is Clara Gunn in [place] Patient was seen and evaluated here April 21 with an episode of chest pain. Sent by his physician's office. Did not have EKG changes or enzyme changes. He declined admission at that time. He states that since that time he gets episode of shortness of breath with minimal exertion multiple times during the day. 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 He states that he isn't exertion required at work will make him short of breath and give him chest pain.  Past Medical History  Diagnosis Date  . Hypertension    History reviewed. No pertinent past surgical history. History reviewed. No pertinent family history. History  Substance Use Topics  . Smoking status: Current Every Day Smoker  . Smokeless tobacco: Not on file  . Alcohol Use: Yes     Comment: daily, heavily- last drank etoh last night.    Review of Systems  Constitutional: Negative for fever, chills, diaphoresis, appetite change and fatigue.  HENT: Negative for mouth sores, sore throat and trouble swallowing.   Eyes: Negative for visual disturbance.  Respiratory: Negative for cough, chest tightness, shortness of breath and wheezing.   Cardiovascular: Positive for chest pain.  Gastrointestinal: Positive for nausea  and vomiting. Negative for abdominal pain, diarrhea and abdominal distention.  Endocrine: Negative for polydipsia, polyphagia and polyuria.  Genitourinary: Negative for dysuria, frequency and hematuria.  Musculoskeletal: Negative for gait problem.  Skin: Positive for rash. Negative for color change and pallor.       "Hives"  Neurological: Negative for dizziness, syncope, light-headedness and headaches.  Hematological: Does not bruise/bleed easily.  Psychiatric/Behavioral: Negative for behavioral problems and confusion.      Allergies  Bee venom  Home Medications   Prior to Admission medications   Medication Sig Start Date End Date Taking? Authorizing Provider  aspirin EC 81 MG tablet Take 81 mg by mouth daily.   Yes Historical Provider, MD  EPINEPHrine (EPIPEN) 0.3 mg/0.3 mL DEVI Inject 0.3 mLs (0.3 mg total) into the muscle as needed. 05/03/13  Yes Devoria AlbeIva Knapp, MD  loratadine (CLARITIN) 10 MG tablet Take 10 mg by mouth daily as needed for allergies.   Yes Historical Provider, MD  diphenhydrAMINE (BENADRYL) 25 mg capsule Take 25 mg by mouth every 6 (six) hours as needed for itching or allergies.    Historical Provider, MD  metoprolol tartrate (LOPRESSOR) 25 MG tablet Take 1 tablet (25 mg total) by mouth 2 (two) times daily. 04/15/15   Jerald KiefStephen K Chiu, MD  predniSONE (DELTASONE) 5 MG tablet Take 1 tablet (5 mg total) by mouth daily with breakfast. 04/15/15   Jerald KiefStephen K Chiu, MD   BP 150/90 mmHg  Pulse 94  Temp(Src) 97.6  F (36.4 C) (Axillary)  Resp 21  Ht 5\' 10"  (1.778 m)  Wt 210 lb 1.6 oz (95.3 kg)  BMI 30.15 kg/m2  SpO2 100% Physical Exam  Constitutional: He is oriented to person, place, and time. He appears well-developed and well-nourished. No distress.  Awake alert and in no acute distress.  HENT:  Head: Normocephalic.  Posterior pharynx benign. Uvula midline. No edema. No stridor.  Eyes: Conjunctivae are normal. Pupils are equal, round, and reactive to light. No scleral  icterus.  Neck: Normal range of motion. Neck supple. No thyromegaly present.  Cardiovascular: Normal rate and regular rhythm.  Exam reveals no gallop and no friction rub.   No murmur heard. Pulmonary/Chest: Effort normal and breath sounds normal. No respiratory distress. He has no wheezes. He has no rales.  Clear bilateral breath sounds without wheezing or prolongation.  Abdominal: Soft. Bowel sounds are normal. He exhibits no distension. There is no tenderness. There is no rebound.  Musculoskeletal: Normal range of motion.  Neurological: He is alert and oriented to person, place, and time.  Skin: Skin is warm and dry. No rash noted.  Diffuse urticaria anterior chest and upper extremities.  Psychiatric: He has a normal mood and affect. His behavior is normal.    ED Course  Procedures (including critical care time) Labs Review Labs Reviewed  CBC WITH DIFFERENTIAL/PLATELET - Abnormal; Notable for the following:    Hemoglobin 17.1 (*)    MCHC 36.3 (*)    Monocytes Relative 13 (*)    All other components within normal limits  BASIC METABOLIC PANEL - Abnormal; Notable for the following:    Glucose, Bld 120 (*)    All other components within normal limits  CBC - Abnormal; Notable for the following:    Hemoglobin 17.9 (*)    MCHC 36.7 (*)    All other components within normal limits  TSH - Abnormal; Notable for the following:    TSH 0.274 (*)    All other components within normal limits  D-DIMER, QUANTITATIVE - Abnormal; Notable for the following:    D-Dimer, Quant 1.58 (*)    All other components within normal limits  COMPREHENSIVE METABOLIC PANEL - Abnormal; Notable for the following:    Glucose, Bld 120 (*)    All other components within normal limits  CBC - Abnormal; Notable for the following:    WBC 12.8 (*)    All other components within normal limits  MRSA PCR SCREENING  TROPONIN I  CREATININE, SERUM  HEMOGLOBIN A1C  LIPID PANEL  TROPONIN I  TROPONIN I  TROPONIN I   ETHANOL  URINE RAPID DRUG SCREEN (HOSP PERFORMED)    Imaging Review No results found.   EKG Interpretation None      MDM   Final diagnoses:  Chest pain, unspecified chest pain type  Urticaria    Patient with apparent allergic reaction. Symptoms including dermatologic, and GI with hives, and nausea vomiting. Clear lungs normal pharynx stable vitals. Plantar steroids, antihistamines, observation. EKG shows no acute or ischemic changes. Sinus tachycardia.  Patient is insistent that he awakened with sensation chest pain before he realized the rash, urticaria, or itching. His chest pain proceeded any vomiting.    Rolland PorterMark Blake Goya, MD 04/18/15 [phone]

## 2015-04-14 NOTE — H&P (Addendum)
[facility] History and Physical  Steven PontGregory D Benner ZOX:[id number] DOB: 19-Feb-1976 DOA: 04/14/2015  Referring physician: Emergency Department, Dr. Fayrene FearingJames PCP: No PCP Per Patient -> [facility] Specialists:   Chief Complaint: Rash, Chest pain  HPI: Steven Bradshaw is a 39 y.o. male  With a hx of HTN and tobacco abuse who was seen recently in the ED for chest pain but declined admission at that time because pt needed to return to work. This am, pt presents to the ED primarily with complaints of a pruritic rash across the arms and chest. During work up, pt was noted to have bouts of continued chest pains that awakened pt at night and associated with nausea/vomiting and SOB. Chest pain historically improved quickly with NTG.  In the ED, pt was given ASA and morphine as well as solumedrol and famotidine. Given presenting chest pain, hospitalist was consulted for admission.  Review of Systems:  Review of Systems  Constitutional: Negative for fever, chills and malaise/fatigue.  HENT: Negative for ear pain and hearing loss.   Eyes: Negative for blurred vision and pain.  Respiratory: Negative for cough, sputum production and wheezing.   Cardiovascular: Positive for chest pain. Negative for palpitations and claudication.  Gastrointestinal: Positive for nausea and vomiting. Negative for abdominal pain and constipation.  Genitourinary: Negative for frequency and flank pain.  Musculoskeletal: Negative for falls and neck pain.  Skin: Positive for itching and rash.  Neurological: Negative for tingling, sensory change, loss of consciousness and headaches.  Endo/Heme/Allergies: Negative for polydipsia. Does not bruise/bleed easily.  Psychiatric/Behavioral: Negative for hallucinations and memory loss.    Past Medical History  Diagnosis Date  . Hypertension    History reviewed. No pertinent past surgical history. Social History:  reports that he has been smoking.  He does not have any smokeless  tobacco history on file. He reports that he drinks alcohol. He reports that he does not use illicit drugs.  where does patient live--home, ALF, SNF? and with whom if at home?  Can patient participate in ADLs?  Allergies  Allergen Reactions  . Bee Venom Anaphylaxis    No family history on file. HTN, DM, Uncle with MI  Prior to Admission medications   Medication Sig Start Date End Date Taking? Authorizing Provider  EPINEPHrine (EPIPEN) 0.3 mg/0.3 mL DEVI Inject 0.3 mLs (0.3 mg total) into the muscle as needed. 05/03/13  Yes Devoria AlbeIva Knapp, MD  loratadine (CLARITIN) 10 MG tablet Take 10 mg by mouth daily as needed for allergies.   Yes Historical Provider, MD  diphenhydrAMINE (BENADRYL) 25 mg capsule Take 25 mg by mouth every 6 (six) hours as needed for itching or allergies.    Historical Provider, MD  famotidine (PEPCID) 40 MG tablet Take 1 tablet (40 mg total) by mouth 2 (two) times daily. Patient not taking: Reported on 03/24/2015 05/03/13   Devoria AlbeIva Knapp, MD  predniSONE (DELTASONE) 20 MG tablet Take 3 po QD x 2d starting tomorrow, then 2 po QD x 3d then 1 po QD x 3d Patient not taking: Reported on 03/24/2015 05/03/13   Devoria AlbeIva Knapp, MD   Physical Exam: Filed Vitals:   04/14/15 0548 04/14/15 0630 04/14/15 0649 04/14/15 0700  BP: 153/94 129/85 129/85 143/86  Pulse: 117 108 115 101  Temp: 97.6 F (36.4 C)     TempSrc: Oral     Resp: 22  20   Height: 5\' 10"  (1.778 m)     Weight: 99.791 kg (220 lb)     SpO2:  98% 96% 98% 98%     General:  Awake, in nad  Eyes: PERRL B  ENT: membranes moist, dentition fair  Neck: trachea midline, neck supple  Cardiovascular: regular, s1, s2  Respiratory: normal resp effort, no wheezing  Abdomen: soft,nondistended  Skin: normal skin turgor, no abnormal skin lesions seen  Musculoskeletal: perfused, no clubbing  Psychiatric: mood/affect normal// no auditory/visual hallucinations  Neurologic: cn2-12 grossly intact, strength/sensation intact  Labs on  Admission:  Basic Metabolic Panel:  Recent Labs Lab 04/14/15 0628  NA 138  K 4.5  CL 108  CO2 25  GLUCOSE 120*  BUN 12  CREATININE 0.70  CALCIUM 9.6   Liver Function Tests: No results for input(s): AST, ALT, ALKPHOS, BILITOT, PROT, ALBUMIN in the last 168 hours. No results for input(s): LIPASE, AMYLASE in the last 168 hours. No results for input(s): AMMONIA in the last 168 hours. CBC:  Recent Labs Lab 04/14/15 0628  WBC 6.7  NEUTROABS 3.7  HGB 17.1*  HCT 47.1  MCV 86.9  PLT 225   Cardiac Enzymes:  Recent Labs Lab 04/14/15 0628  TROPONINI <0.03    BNP (last 3 results) No results for input(s): BNP in the last 8760 hours.  ProBNP (last 3 results) No results for input(s): PROBNP in the last 8760 hours.  CBG: No results for input(s): GLUCAP in the last 168 hours.  Radiological Exams on Admission: No results found.  EKG: Independently reviewed. Sinus tach without evidence of ischemia  Assessment/Plan Principal Problem:   Chest pain Active Problems:   Allergic reaction   Essential hypertension   Tobacco abuse   1. Chest pain 1. Associated with nausea, awakening pt from sleep 2. Originally referred to Cardiology as outpatient  3. Initial trop neg, as was trop from 4/21 from recent visit for chest pain 4. Pain historically improved quickly with NTG 5. Pain worse with exertion, associated with sob, nausea, vomiting 6. EKG unremarkable for ischemic changes per my own read 7. Will admit pt to med-tele, obs 8. Follow serial trop 9. Check 2d echo 10. Given risk factors and on-going chest pains, would consult Cardiology for further recs 2. Rash, suspected allergic rxn 1. Solumedrol given in ED, rash improved 2. Will cont on prednisone for now 3. Pt has hx of anaphylactic rxn to bee sting 3. HTN 1. BP stable, albeit not ideally controlled 2. Will start on metoprolol given mild sinus tach 4. Tobacco abuse 1. Cessation done 2. Pt agrees with nicotine  patch 5. ETOH abuse 1. Pt admits to drinking 12-18 beers/day 2. Will order CIWA 6. DVT prophylaxis 1. Heparin subQ  Code Status: Full  Family Communication: Pt in room  Disposition Plan: Admit to med-tele    CHIU, Scheryl MartenSTEPHEN K [facility] Pager [phone]  If 7PM-7AM, please contact night-coverage [email or web address] Password [email or web address] 04/14/2015, 7:47 AM

## 2015-04-14 NOTE — Progress Notes (Signed)
Exercise stress test reviewed, waiting for it to load into cupid report statin. Stress test shows no evidence of ischemia, low risk study.    Dominga FerryJ Zenita Kister MD

## 2015-04-14 NOTE — Progress Notes (Signed)
Pt has markedly reduced exercise tolerance on treadmill. Unable to go more than 3:50 of Bruce. He did achieve adequete HR. No ST changes or chest pain. Markedly SOB. Will check TSH ("I'm always at full speed") and echo. With negative Troponin PE seems unlikely.   Corine ShelterLUKE Caitlyne Ingham PA-C 04/14/2015 2:40 PM

## 2015-04-15 ENCOUNTER — Observation Stay (HOSPITAL_COMMUNITY): Payer: BLUE CROSS/BLUE SHIELD

## 2015-04-15 DIAGNOSIS — R079 Chest pain, unspecified: Secondary | ICD-10-CM | POA: Diagnosis not present

## 2015-04-15 DIAGNOSIS — F1010 Alcohol abuse, uncomplicated: Secondary | ICD-10-CM | POA: Diagnosis not present

## 2015-04-15 DIAGNOSIS — Z720 Tobacco use: Secondary | ICD-10-CM | POA: Diagnosis not present

## 2015-04-15 DIAGNOSIS — I10 Essential (primary) hypertension: Secondary | ICD-10-CM | POA: Diagnosis not present

## 2015-04-15 LAB — CBC
HEMATOCRIT: 45.2 % (ref 39.0–52.0)
HEMOGLOBIN: 16.1 g/dL (ref 13.0–17.0)
MCH: 31.4 pg (ref 26.0–34.0)
MCHC: 35.6 g/dL (ref 30.0–36.0)
MCV: 88.1 fL (ref 78.0–100.0)
PLATELETS: 258 10*3/uL (ref 150–400)
RBC: 5.13 MIL/uL (ref 4.22–5.81)
RDW: 11.7 % (ref 11.5–15.5)
WBC: 12.8 10*3/uL — ABNORMAL HIGH (ref 4.0–10.5)

## 2015-04-15 LAB — COMPREHENSIVE METABOLIC PANEL
ALT: 52 U/L (ref 17–63)
AST: 19 U/L (ref 15–41)
Albumin: 3.8 g/dL (ref 3.5–5.0)
Alkaline Phosphatase: 84 U/L (ref 38–126)
Anion gap: 9 (ref 5–15)
BUN: 16 mg/dL (ref 6–20)
CHLORIDE: 105 mmol/L (ref 101–111)
CO2: 23 mmol/L (ref 22–32)
Calcium: 9.4 mg/dL (ref 8.9–10.3)
Creatinine, Ser: 0.64 mg/dL (ref 0.61–1.24)
GFR calc Af Amer: >60 mL/min (ref >60)
GFR calc non Af Amer: >60 mL/min (ref >60)
Glucose, Bld: 120 mg/dL — ABNORMAL HIGH (ref 65–99)
Potassium: 3.9 mmol/L (ref 3.5–5.1)
SODIUM: 137 mmol/L (ref 135–145)
Total Bilirubin: 0.8 mg/dL (ref 0.3–1.2)
Total Protein: 6.9 g/dL (ref 6.5–8.1)

## 2015-04-15 LAB — HEMOGLOBIN A1C
Hgb A1c MFr Bld: 5.2 % (ref 4.8–5.6)
MEAN PLASMA GLUCOSE: 103 mg/dL

## 2015-04-15 MED ORDER — PREDNISONE 5 MG PO TABS: 5.0000 mg | ORAL_TABLET | Freq: Every day | ORAL | Status: DC

## 2015-04-15 MED ORDER — IOHEXOL 350 MG/ML SOLN
100.0000 mL | Freq: Once | INTRAVENOUS | Status: AC | PRN
  Administered 2015-04-15: 100 mL via INTRAVENOUS

## 2015-04-15 MED ORDER — METOPROLOL TARTRATE 25 MG PO TABS: 25.0000 mg | ORAL_TABLET | Freq: Two times a day (BID) | ORAL | Status: DC

## 2015-04-15 NOTE — Discharge Summary (Addendum)
Physician Discharge Summary  Steven PontGregory D Bradshaw [id number] DOB: [birth year] DOA: 04/14/2015  PCP: Laqueta LindenKONESWARAN, SURESH A, MD  Admit date: 04/14/2015 Discharge date: 04/15/2015  Time spent: 20 minutes  Recommendations for Outpatient Follow-up:  1. Establish and follow up with PCP as scheduled 2. Please repeat non-contrast CT chest in 3 months to f/u on suspected residual thymus  Discharge Diagnoses:  Principal Problem:   Chest pain with moderate risk of acute coronary syndrome Active Problems:   Allergic reaction   Essential hypertension   Tobacco abuse   ETOH abuse   Discharge Condition: Stable  Diet recommendation: Heart healthy  Filed Weights   04/14/15 0548 04/14/15 0929 04/15/15 0500  Weight: 99.791 kg (220 lb) 96 kg (211 lb 10.3 oz) 95.3 kg (210 lb 1.6 oz)    History of present illness:  Please see admit h and p from 5/12 for details. Briefly, pt presented with complaints of on-going chest pains and sob. The patient was admitted for further work up.  Hospital Course:  1. Chest pain 1. Associated with nausea, awakening pt from sleep 2. Cardiology consulted 3. Trop found to be serially neg 4. Pain historically improved quickly with NTG 5. Pain worse with exertion, associated with sob, nausea, vomiting 6. EKG was unremarkable for ischemic changes per my own read 7. Check 2d echo with normal EF and no WMA 8. Pt underwent normal stress test on 5/12 9. Ddimer was elevated and pt underwent chest CT A that revealed no PE, however soft tissue in the anterior mediastinum was noted suspicious for residual thymus with recommendation for repeat CT w/o contrast in 3 mos 2. Rash, suspected allergic rxn 1. Solumedrol given in ED, rash improved 2. Will cont on prednisone for now 3. Pt has hx of anaphylactic rxn to bee sting 3. HTN 1. BP stable, albeit not ideally controlled 2. Started on metoprolol 4. Tobacco abuse 1. Cessation done 2. Pt agrees with nicotine patch 5. ETOH  abuse 1. Pt admits to drinking 12-18 beers/day 2. Will order CIWA 6. DVT prophylaxis 1. Heparin subQ 7. Rash/suspected allergic reaction 1. Unclear etiology 2. Improved with steroids  Procedures:  Stress test 5/12 - normal  CTA 5/13 - no PE  Consultations:  Cardiology  Discharge Exam: Filed Vitals:   04/15/15 0500 04/15/15 0700 04/15/15 0800 04/15/15 1000  BP:  140/74  150/90  Pulse:  81  94  Temp:   97.6 F (36.4 C)   TempSrc:   Axillary   Resp:  20  21  Height:      Weight: 95.3 kg (210 lb 1.6 oz)     SpO2:  98%  100%    General: awake, in nad Cardiovascular: regular, s1, s2 Respiratory: normal resp effort, no wheezing  Discharge Instructions     Medication List    STOP taking these medications        atenolol-chlorthalidone 50-25 MG per tablet  Commonly known as:  TENORETIC     famotidine 40 MG tablet  Commonly known as:  PEPCID      TAKE these medications        aspirin EC 81 MG tablet  Take 81 mg by mouth daily.     diphenhydrAMINE 25 mg capsule  Commonly known as:  BENADRYL  Take 25 mg by mouth every 6 (six) hours as needed for itching or allergies.     EPINEPHrine 0.3 mg/0.3 mL Soaj injection  Commonly known as:  EPIPEN  Inject 0.3 mLs (0.3 mg total) into  the muscle as needed.     loratadine 10 MG tablet  Commonly known as:  CLARITIN  Take 10 mg by mouth daily as needed for allergies.     metoprolol tartrate 25 MG tablet  Commonly known as:  LOPRESSOR  Take 1 tablet (25 mg total) by mouth 2 (two) times daily.     predniSONE 5 MG tablet  Commonly known as:  DELTASONE  Take 1 tablet (5 mg total) by mouth daily with breakfast.       Allergies  Allergen Reactions  . Bee Venom Anaphylaxis   Follow-up Information    Schedule an appointment as soon as possible for a visit with Wilson Singer, MD.   Specialty:  Internal Medicine   Why:  Hospital follow up   Contact information:   [address] [place]  [address] [phone]        The results of significant diagnostics from this hospitalization (including imaging, microbiology, ancillary and laboratory) are listed below for reference.    Significant Diagnostic Studies: Ct Angio Chest Pe W/cm &/or Wo Cm  04/15/2015   CLINICAL DATA:  Chest pain and shortness of breath. D-dimer 1.58. History of smoking.  EXAM: CT ANGIOGRAPHY CHEST WITH CONTRAST  TECHNIQUE: Multidetector CT imaging of the chest was performed using the standard protocol during bolus administration of intravenous contrast. Multiplanar CT image reconstructions and MIPs were obtained to evaluate the vascular anatomy.  CONTRAST:  OMNIPAQUE IOHEXOL 350 MG/ML SOLN  COMPARISON:  Chest radiograph, 03/24/2015.  FINDINGS: Angiographic study: No evidence of a pulmonary embolus. Thoracic aorta normal in caliber. No atherosclerotic plaque. Minor coronary artery calcifications.  Thoracic inlet:  Unremarkable.  Mediastinum and hila: There is anterior mediastinal soft tissue measuring 7.5 cm in length by 2.9 cm x 2.1 cm transversely. This has the appearance of residual thymus although this degree of residual thymic tissue is atypical for this patient's age. It does not appear to have mass effect.  Heart is normal in size configuration. No mediastinal adenopathy. No hilar masses or adenopathy. Great vessels are normal in caliber.  Lungs and pleura: Clear lungs. No mass or suspicious nodule. No pleural effusion or pneumothorax.  Limited upper abdomen:  Unremarkable.  Musculoskeletal:  Unremarkable.  Review of the MIP images confirms the above findings.  IMPRESSION: 1. No evidence of a pulmonary embolus. 2. No acute finding. 3. Soft tissue in the anterior mediastinum. This has the configuration of residual thymus although is somewhat atypical in amount for this patient's age. Recommend follow-up CT without contrast in 3 months to document stability. 4. Minor coronary artery calcifications. 5. No other  abnormalities.   Electronically Signed   By: Amie Portland M.D.   On: 04/15/2015 09:15   Nm Myocar Multi W/spect W/wall Motion / Ef  04/14/2015   CLINICAL DATA:  39 year old male with no known history of coronary artery disease referred for chest pain.  EXAM: MYOCARDIAL IMAGING WITH SPECT (REST AND EXERCISE)  GATED LEFT VENTRICULAR WALL MOTION STUDY  LEFT VENTRICULAR EJECTION FRACTION  TECHNIQUE: Standard myocardial SPECT imaging was performed after resting intravenous injection of 10 mCi Tc-87m sestamibi. Subsequently, exercise tolerance test was performed by the patient under the supervision of the Cardiology staff. At peak-stress, 30 mCi Tc-58m sestamibi was injected intravenously and standard myocardial SPECT imaging was performed. Quantitative gated imaging was also performed to evaluate left ventricular wall motion, and estimate left ventricular ejection fraction.  COMPARISON:  None.  FINDINGS: Exercise stress  Patient was exercised according  to the Bruce protocol for 3 minutes and 51 seconds achieving a maximum work level of 6.4 Mets. The resting heart rate of 111 beats per minute rose to a maximum 162 beats per minute, representing 90% of the maximal age predicted heart rate. The resting blood pressure of 145/96 rose to a maximum of 176/85. The test was stopped due to fatigue due to fatigue, the patient did not experience any chest pain. Baseline EKG showed normal sinus rhythm, stress EKG showed no specific ischemic changes and no significant arrhythmias.  Perfusion: No decreased activity in the left ventricle on stress imaging to suggest reversible ischemia or infarction.  Wall Motion: Normal left ventricular wall motion. No left ventricular dilation.  Left Ventricular Ejection Fraction: 63 %  End diastolic volume 97 ml  End systolic volume 35 ml  IMPRESSION: 1. No reversible ischemia or infarction.  2. Normal left ventricular wall motion.  3. Left ventricular ejection fraction 63%  4. Low-risk stress  test findings*.  *2012 Appropriate Use Criteria for Coronary Revascularization Focused Update: J Am Coll Cardiol. 2012;59(9):857-881. http://content.dementiazones.com.aspx?articleid=1201161   Electronically Signed   By: Dina Rich   On: 04/14/2015 15:47   Dg Chest Portable 1 View  03/24/2015   CLINICAL DATA:  Acute chest pain.  EXAM: PORTABLE CHEST - 1 VIEW  COMPARISON:  December 14, 2007.  FINDINGS: The heart size and mediastinal contours are within normal limits. Both lungs are clear. No pneumothorax or pleural effusion is noted. The visualized skeletal structures are unremarkable.  IMPRESSION: No acute cardiopulmonary abnormality seen.   Electronically Signed   By: Lupita Raider, M.D.   On: 03/24/2015 11:37    Microbiology: Recent Results (from the past 240 hour(s))  MRSA PCR Screening     Status: None   Collection Time: 04/14/15  9:53 AM  Result Value Ref Range Status   MRSA by PCR NEGATIVE NEGATIVE Final    Comment:        The GeneXpert MRSA Assay (FDA approved for NASAL specimens only), is one component of a comprehensive MRSA colonization surveillance program. It is not intended to diagnose MRSA infection nor to guide or monitor treatment for MRSA infections.      Labs: Basic Metabolic Panel:  Recent Labs Lab 04/14/15 0628 04/14/15 0930 04/15/15 0503  NA 138  --  137  K 4.5  --  3.9  CL 108  --  105  CO2 25  --  23  GLUCOSE 120*  --  120*  BUN 12  --  16  CREATININE 0.70 0.66 0.64  CALCIUM 9.6  --  9.4   Liver Function Tests:  Recent Labs Lab 04/15/15 0503  AST 19  ALT 52  ALKPHOS 84  BILITOT 0.8  PROT 6.9  ALBUMIN 3.8   No results for input(s): LIPASE, AMYLASE in the last 168 hours. No results for input(s): AMMONIA in the last 168 hours. CBC:  Recent Labs Lab 04/14/15 0628 04/14/15 0944 04/15/15 0503  WBC 6.7 8.9 12.8*  NEUTROABS 3.7  --   --   HGB 17.1* 17.9* 16.1  HCT 47.1 48.8 45.2  MCV 86.9 86.4 88.1  PLT 225 244 258    Cardiac Enzymes:  Recent Labs Lab 04/14/15 0628 04/14/15 0930 04/14/15 1541 04/14/15 2144  TROPONINI <0.03 <0.03 <0.03 <0.03   BNP: BNP (last 3 results) No results for input(s): BNP in the last 8760 hours.  ProBNP (last 3 results) No results for input(s): PROBNP in the last 8760 hours.  CBG: No results for input(s): GLUCAP in the last 168 hours.     Signed:  CHIU, Scheryl MartenSTEPHEN K  [facility] 04/15/2015, 6:36 PM

## 2015-04-15 NOTE — Care Management Note (Signed)
Case Management Note  Patient Details  Name: Eulah PontGregory D Berkley MRN: [id number] Date of Birth: [birth year]  Expected Discharge Date:  04/17/15               Expected Discharge Plan:  Home/Self Care  In-House Referral:  NA  Discharge planning Services  CM Consult  Post Acute Care Choice:  NA Choice offered to:  NA  DME Arranged:    DME Agency:     HH Arranged:    HH Agency:     Status of Service:  Completed, signed off  Medicare Important Message Given:    Date Medicare IM Given:    Medicare IM give by:    Date Additional Medicare IM Given:    Additional Medicare Important Message give by:     If discussed at Long Length of Stay Meetings, dates discussed:    Additional Comments: Pt is from home and independent at baseline. Pt planning to DC home today. No CM needs.  Malcolm Metrohildress, Biana Haggar Demske, RN 04/15/2015, 3:29 PM

## 2015-04-15 NOTE — Progress Notes (Signed)
D.c instructions reviewed with patient.  Verbalized understanding.  Pt dc'd to home. Schonewitz, Candelaria StagersLeigh Anne 04/15/2015

## 2015-04-22 ENCOUNTER — Ambulatory Visit: Payer: BLUE CROSS/BLUE SHIELD | Admitting: Cardiology

## 2015-09-28 ENCOUNTER — Emergency Department (HOSPITAL_COMMUNITY)
Admission: EM | Admit: 2015-09-28 | Discharge: 2015-09-29 | Disposition: A | Discharge status: Home / Self Care | Payer: BLUE CROSS/BLUE SHIELD | Attending: Emergency Medicine | Admitting: Emergency Medicine

## 2015-09-28 ENCOUNTER — Encounter (HOSPITAL_COMMUNITY): Payer: Self-pay | Admitting: Emergency Medicine

## 2015-09-28 DIAGNOSIS — I10 Essential (primary) hypertension: Secondary | ICD-10-CM | POA: Insufficient documentation

## 2015-09-28 DIAGNOSIS — Z7952 Long term (current) use of systemic steroids: Secondary | ICD-10-CM | POA: Insufficient documentation

## 2015-09-28 DIAGNOSIS — R45851 Suicidal ideations: Secondary | ICD-10-CM | POA: Insufficient documentation

## 2015-09-28 DIAGNOSIS — Z720 Tobacco use: Secondary | ICD-10-CM | POA: Insufficient documentation

## 2015-09-28 DIAGNOSIS — R000 Tachycardia, unspecified: Secondary | ICD-10-CM | POA: Insufficient documentation

## 2015-09-28 DIAGNOSIS — Z79899 Other long term (current) drug therapy: Secondary | ICD-10-CM | POA: Insufficient documentation

## 2015-09-28 DIAGNOSIS — F1010 Alcohol abuse, uncomplicated: Secondary | ICD-10-CM | POA: Insufficient documentation

## 2015-09-28 HISTORY — DX: Alcohol dependence, uncomplicated: F10.20

## 2015-09-28 HISTORY — DX: Reserved for inherently not codable concepts without codable children: IMO0001

## 2015-09-28 HISTORY — DX: Suicidal ideations: R45.851

## 2015-09-28 LAB — COMPREHENSIVE METABOLIC PANEL
ALBUMIN: 3.5 g/dL (ref 3.5–5.0)
ALT: 43 U/L (ref 17–63)
ANION GAP: 9 (ref 5–15)
AST: 26 U/L (ref 15–41)
Alkaline Phosphatase: 109 U/L (ref 38–126)
BUN: 8 mg/dL (ref 6–20)
CHLORIDE: 103 mmol/L (ref 101–111)
CO2: 25 mmol/L (ref 22–32)
Calcium: 9.1 mg/dL (ref 8.9–10.3)
Creatinine, Ser: 0.67 mg/dL (ref 0.61–1.24)
GFR calc Af Amer: >60 mL/min (ref >60)
GFR calc non Af Amer: >60 mL/min (ref >60)
Glucose, Bld: 60 mg/dL — ABNORMAL LOW (ref 65–99)
Potassium: 4.5 mmol/L (ref 3.5–5.1)
SODIUM: 137 mmol/L (ref 135–145)
Total Bilirubin: 0.4 mg/dL (ref 0.3–1.2)
Total Protein: 6.4 g/dL — ABNORMAL LOW (ref 6.5–8.1)

## 2015-09-28 LAB — RAPID URINE DRUG SCREEN, HOSP PERFORMED
AMPHETAMINES: NOT DETECTED
BARBITURATES: NOT DETECTED
Benzodiazepines: NOT DETECTED
Cocaine: NOT DETECTED
Opiates: NOT DETECTED
Tetrahydrocannabinol: NOT DETECTED

## 2015-09-28 LAB — ETHANOL: Alcohol, Ethyl (B): 243 mg/dL — ABNORMAL HIGH (ref <5)

## 2015-09-28 LAB — CBC
HEMATOCRIT: 43.3 % (ref 39.0–52.0)
Hemoglobin: 15.2 g/dL (ref 13.0–17.0)
MCH: 29.7 pg (ref 26.0–34.0)
MCHC: 35.1 g/dL (ref 30.0–36.0)
MCV: 84.7 fL (ref 78.0–100.0)
PLATELETS: 227 10*3/uL (ref 150–400)
RBC: 5.11 MIL/uL (ref 4.22–5.81)
RDW: 11.9 % (ref 11.5–15.5)
WBC: 9.1 10*3/uL (ref 4.0–10.5)

## 2015-09-28 LAB — ACETAMINOPHEN LEVEL

## 2015-09-28 LAB — SALICYLATE LEVEL: Salicylate Lvl: <4 mg/dL (ref 2.8–30.0)

## 2015-09-28 MED ORDER — ALUM & MAG HYDROXIDE-SIMETH 200-200-20 MG/5ML PO SUSP: 30.0000 mL | ORAL | Status: DC | PRN

## 2015-09-28 MED ORDER — THIAMINE HCL 100 MG/ML IJ SOLN: 100.0000 mg | Freq: Every day | INTRAMUSCULAR | Status: DC

## 2015-09-28 MED ORDER — LORAZEPAM 1 MG PO TABS: 0.0000 mg | ORAL_TABLET | Freq: Two times a day (BID) | ORAL | Status: DC

## 2015-09-28 MED ORDER — DIAZEPAM 5 MG PO TABS
10.0000 mg | ORAL_TABLET | Freq: Once | ORAL | Status: DC
  Filled 2015-09-28: qty 2

## 2015-09-28 MED ORDER — ACETAMINOPHEN 325 MG PO TABS: 650.0000 mg | ORAL_TABLET | ORAL | Status: DC | PRN

## 2015-09-28 MED ORDER — VITAMIN B-1 100 MG PO TABS
100.0000 mg | ORAL_TABLET | Freq: Every day | ORAL | Status: DC
  Administered 2015-09-29: 100 mg via ORAL
  Filled 2015-09-28: qty 1

## 2015-09-28 MED ORDER — LORAZEPAM 1 MG PO TABS: 0.0000 mg | ORAL_TABLET | Freq: Four times a day (QID) | ORAL | Status: DC

## 2015-09-28 MED ORDER — IBUPROFEN 200 MG PO TABS: 600.0000 mg | ORAL_TABLET | Freq: Three times a day (TID) | ORAL | Status: DC | PRN

## 2015-09-28 MED ORDER — ONDANSETRON HCL 4 MG PO TABS: 4.0000 mg | ORAL_TABLET | Freq: Three times a day (TID) | ORAL | Status: DC | PRN

## 2015-09-28 NOTE — ED Provider Notes (Addendum)
CSN: [id number]     Arrival date & time 09/28/15  2015 History   First MD Initiated Contact with Patient 09/28/15 2036     Chief Complaint  Patient presents with  . Suicidal  . Alcohol Problem     (Consider location/radiation/quality/duration/timing/severity/associated sxs/prior Treatment) HPI Comments: 39 year old male with a history of hypertension, alcohol abuse who presents with suicidal ideation and alcoholism. Patient presents requesting behavioral health for placement for alcohol detox. He states that he drinks 25-30 beers or 2 fifths of liquor per day, last drink just prior to arrival. The patient stated at triage that he had suicidal thoughts including plans to drive his truck into other vehicles. During my interview, the patient stated that the only way to be admitted by behavioral health to be suicidal. He denies any SI or HI during my interview and also denies any hallucinations but states that I should tell behavioral health that he is suicidal so he will be admitted. He denies any history of suicide attempts. He denies any other drug use. He denies any other complaints currently.  Patient is a 39 y.o. male presenting with alcohol problem. The history is provided by the patient.  Alcohol Problem    Past Medical History  Diagnosis Date  . Hypertension   . Alcoholism /alcohol abuse (HCC)   . Suicidal ideation    History reviewed. No pertinent past surgical history. No family history on file. Social History  Substance Use Topics  . Smoking status: Current Every Day Smoker  . Smokeless tobacco: None  . Alcohol Use: Yes     Comment: daily, heavily- last drank etoh last night.    Review of Systems 10 Systems reviewed and are negative for acute change except as noted in the HPI.    Allergies  Bee venom  Home Medications   Prior to Admission medications   Medication Sig Start Date End Date Taking? Authorizing Provider  EPINEPHrine (EPIPEN) 0.3 mg/0.3 mL DEVI Inject  0.3 mLs (0.3 mg total) into the muscle as needed. 05/03/13  Yes Devoria AlbeIva Knapp, MD  loratadine (CLARITIN) 10 MG tablet Take 10 mg by mouth daily as needed for allergies.   Yes Historical Provider, MD  metoprolol tartrate (LOPRESSOR) 25 MG tablet Take 1 tablet (25 mg total) by mouth 2 (two) times daily. 04/15/15   Jerald KiefStephen K Chiu, MD  predniSONE (DELTASONE) 5 MG tablet Take 1 tablet (5 mg total) by mouth daily with breakfast. 04/15/15   Jerald KiefStephen K Chiu, MD   BP 134/74 mmHg  Pulse 118  Temp(Src) 97.8 F (36.6 C) (Oral)  Resp 16  Ht 5\' 10"  (1.778 m)  Wt 210 lb (95.255 kg)  BMI 30.13 kg/m2  SpO2 99% Physical Exam  Constitutional: He is oriented to person, place, and time. He appears well-developed and well-nourished. No distress.  HENT:  Head: Normocephalic and atraumatic.  Moist mucous membranes  Eyes: Conjunctivae are normal. Pupils are equal, round, and reactive to light.  Neck: Neck supple.  Cardiovascular: Regular rhythm and normal heart sounds.   No murmur heard. tachycardic  Pulmonary/Chest: Effort normal and breath sounds normal.  Abdominal: Soft. Bowel sounds are normal. He exhibits no distension. There is no tenderness.  Musculoskeletal: He exhibits no edema.  Neurological: He is alert and oriented to person, place, and time.  Fluent speech  Skin: Skin is warm and dry.  Psychiatric:  Avoids eye contact, flat affect  Nursing note and vitals reviewed.   ED Course  Procedures (including critical care time) Labs  Review Labs Reviewed  COMPREHENSIVE METABOLIC PANEL - Abnormal; Notable for the following:    Glucose, Bld 60 (*)    Total Protein 6.4 (*)    All other components within normal limits  ETHANOL - Abnormal; Notable for the following:    Alcohol, Ethyl (B) 243 (*)    All other components within normal limits  ACETAMINOPHEN LEVEL - Abnormal; Notable for the following:    Acetaminophen (Tylenol), Serum <10 (*)    All other components within normal limits  SALICYLATE LEVEL   CBC  URINE RAPID DRUG SCREEN, HOSP PERFORMED    Imaging Review No results found. I have personally reviewed and evaluated these lab results as part of my medical decision-making.   EKG Interpretation None     Medications  diazepam (VALIUM) tablet 10 mg (10 mg Oral Not Given 09/28/15 2058)  LORazepam (ATIVAN) tablet 0-4 mg (not administered)    Followed by  LORazepam (ATIVAN) tablet 0-4 mg (not administered)  thiamine (VITAMIN B-1) tablet 100 mg (not administered)    Or  thiamine (B-1) injection 100 mg (not administered)  ondansetron (ZOFRAN) tablet 4 mg (not administered)  alum & mag hydroxide-simeth (MAALOX/MYLANTA) 200-200-20 MG/5ML suspension 30 mL (not administered)  acetaminophen (TYLENOL) tablet 650 mg (not administered)  ibuprofen (ADVIL,MOTRIN) tablet 600 mg (not administered)   MDM   Final diagnoses:  Alcohol abuse  Suicidal ideation    39 year old male who presents with request for alcohol detox and stated to triage that he was suicidal. Patient tachycardic but other vital signs stable at presentation. No tremor or other focal abnormality on exam. Patient stated that he endorsed suicidal ideation so that he would be admitted. Obtained above labs which showed BAL 243, otherwise unremarkable labs. Patient without any other complaints. I have ordered valium for tachycardia but patient has refused. I have consulted TTS for evaluation. The patient's disposition will be determined by the psychiatry team.  12:38 AM Spoke with TTS provider who staffed pt with psychiatry PA. They feel that patient doesn't meet inpatient criteria as he readily admits that he is not actually suicidal, rather he just said that to be admitted for detox. They have provided list of outpatient resources which I have passed along to patient. On reexamination, he is calm and demonstrates no signs of acute withdrawal. Explained outpatient f/u plan and patient voiced understanding.   Laurence Spates, MD 09/28/15 2145  Laurence Spates, MD 09/29/15 [phone]

## 2015-09-28 NOTE — ED Notes (Signed)
Staffing office notified for pt.'s sitter , security notified to wand pt. , purple scrubs given to pt. at triage .

## 2015-09-28 NOTE — ED Notes (Signed)
Security wanded pt. at triage. 

## 2015-09-28 NOTE — ED Notes (Signed)
Pt. 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

## 2015-09-28 NOTE — ED Notes (Signed)
Pt refused valium at this time

## 2015-09-29 NOTE — Discharge Instructions (Signed)
Alcohol Abuse and Nutrition °Alcohol abuse is any pattern of alcohol consumption that harms your health, relationships, or work. Alcohol abuse can affect how your body breaks down and absorbs nutrients from food by causing your liver to work abnormally. Additionally, many people who abuse alcohol do not eat enough carbohydrates, protein, fat, vitamins, and minerals. This can cause poor nutrition (malnutrition) and a lack of nutrients (nutrient deficiencies), which can lead to further complications. °Nutrients that are commonly lacking (deficient) among people who abuse alcohol include: °· Vitamins. °¨ Vitamin A. This is stored in your liver. It is important for your vision, metabolism, and ability to fight off infections (immunity). °¨ B vitamins. These include vitamins such as folate, thiamin, and niacin. These are important in new cell growth and maintenance. °¨ Vitamin C. This plays an important role in iron absorption, wound healing, and immunity. °¨ Vitamin D. This is produced by your liver, but you can also get vitamin D from food. Vitamin D is necessary for your body to absorb and use calcium. °· Minerals. °¨ Calcium. This is important for your bones and your heart and blood vessel (cardiovascular) function. °¨ Iron. This is important for blood, muscle, and nervous system functioning. °¨ Magnesium. This plays an important role in muscle and nerve function, and it helps to control blood sugar and blood pressure. °¨ Zinc. This is important for the normal function of your nervous system and digestive system (gastrointestinal tract). °Nutrition is an essential component of therapy for alcohol abuse. Your health care provider or dietitian will work with you to design a plan that can help restore nutrients to your body and prevent potential complications. °WHAT IS MY PLAN? °Your dietitian may develop a specific diet plan that is based on your condition and any other complications you may have. A diet plan will  commonly include: °· A balanced diet. °¨ Grains: 6-8 oz per day. °¨ Vegetables: 2-3 cups per day. °¨ Fruits: 1-2 cups per day. °¨ Meat and other protein: 5-6 oz per day. °¨ Dairy: 2-3 cups per day. °· Vitamin and mineral supplements. °WHAT DO I NEED TO KNOW ABOUT ALCOHOL AND NUTRITION? °· Consume foods that are high in antioxidants, such as grapes, berries, nuts, green tea, and dark green and orange vegetables. This can help to counteract some of the stress that is placed on your liver by consuming alcohol. °· Avoid food and drinks that are high in fat and sugar. Foods such as sugared soft drinks, salty snack foods, and candy contain empty calories. This means that they lack important nutrients such as protein, fiber, and vitamins. °· Eat frequent meals and snacks. Try to eat 5-6 small meals each day. °· Eat a variety of fresh fruits and vegetables each day. This will help you get plenty of water, fiber, and vitamins in your diet. °· Drink plenty of water and other clear fluids. Try to drink at least 48-64 oz (1.5-2 L) of water per day. °· If you are a vegetarian, eat a variety of protein-rich foods. Pair whole grains with plant-based proteins at meals and snacks to obtain the greatest nutrient benefit from your food. For example, eat rice with beans, put peanut butter on whole-grain toast, or eat oatmeal with sunflower seeds. °· Soak beans and whole grains overnight before cooking. This can help your body to absorb the nutrients more easily. °· Include foods fortified with vitamins and minerals in your diet. Commonly fortified foods include milk, orange juice, cereal, and bread. °· If you   are malnourished, your dietitian may recommend a high-protein, high-calorie diet. This may include: °¨ 2,000-3,000 calories (kilocalories) per day. °¨ 70-100 grams of protein per day. °· Your health care provider may recommend a complete nutritional supplement beverage. This can help to restore calories, protein, and vitamins to  your body. Depending on your condition, you may be advised to consume this instead of or in addition to meals. °· Limit your intake of caffeine. Replace drinks like coffee and black tea with decaffeinated coffee and herbal tea. °· Eat a variety of foods that are high in omega fatty acids. These include fish, nuts and seeds, and soybeans. These foods may help your liver to recover and may also stabilize your mood. °· Certain medicines may cause changes in your appetite, taste, and weight. Work with your health care provider and dietitian to make any adjustments to your medicines and diet plan. °· Include other healthy lifestyle choices in your daily routine. °¨ Be physically active. °¨ Get enough sleep. °¨ Spend time doing activities that you enjoy. °· If you are unable to take in enough food and calories by mouth, your health care provider may recommend a feeding tube. This is a tube that passes through your nose and throat, directly into your stomach. Nutritional supplement beverages can be given to you through the feeding tube to help you get the nutrients you need. °· Take vitamin or mineral supplements as recommended by your health care provider. °WHAT FOODS CAN I EAT? °Grains °Enriched pasta. Enriched rice. Fortified whole-grain bread. Fortified whole-grain cereal. Barley. Brown rice. Quinoa. Millet. °Vegetables °All fresh, frozen, and canned vegetables. Spinach. Kale. Artichoke. Carrots. Winter squash and pumpkin. Sweet potatoes. Broccoli. Cabbage. Cucumbers. Tomatoes. Sweet peppers. Green beans. Peas. Corn. °Fruits °All fresh and frozen fruits. Berries. Grapes. Mango. Papaya. Guava. Cherries. Apples. Bananas. Peaches. Plums. Pineapple. Watermelon. Cantaloupe. Oranges. Avocado. °Meats and Other Protein Sources °Beef liver. Lean beef. Pork. Fresh and canned chicken. Fresh fish. Oysters. Sardines. Canned tuna. Shrimp. Eggs with yolks. Nuts and seeds. Peanut butter. Beans and lentils. Soybeans.  Tofu. °Dairy °Whole, low-fat, and nonfat milk. Whole, low-fat, and nonfat yogurt. Cottage cheese. Sour cream. Hard and soft cheeses. °Beverages °Water. Herbal tea. Decaffeinated coffee. Decaffeinated green tea. 100% fruit juice. 100% vegetable juice. Instant breakfast shakes. °Condiments °Ketchup. Mayonnaise. Mustard. Salad dressing. Barbecue sauce. °Sweets and Desserts °Sugar-free ice cream. Sugar-free pudding. Sugar-free gelatin. °Fats and Oils °Butter. Vegetable oil, flaxseed oil, olive oil, and walnut oil. °Other °Complete nutrition shakes. Protein bars. Sugar-free gum. °The items listed above may not be a complete list of recommended foods or beverages. Contact your dietitian for more options. °WHAT FOODS ARE NOT RECOMMENDED? °Grains °Sugar-sweetened breakfast cereals. Flavored instant oatmeal. Fried breads. °Vegetables °Breaded or deep-fried vegetables. °Fruits °Dried fruit with added sugar. Candied fruit. Canned fruit in syrup. °Meats and Other Protein Sources °Breaded or deep-fried meats. °Dairy °Flavored milks. Fried cheese curds or fried cheese sticks. °Beverages °Alcohol. Sugar-sweetened soft drinks. Sugar-sweetened tea. Caffeinated coffee and tea. °Condiments °Sugar. Honey. Agave nectar. Molasses. °Sweets and Desserts °Chocolate. Cake. Cookies. Candy. °Other °Potato chips. Pretzels. Salted nuts. Candied nuts. °The items listed above may not be a complete list of foods and beverages to avoid. Contact your dietitian for more information. °  °This information is not intended to replace advice given to you by your health care provider. Make sure you discuss any questions you have with your health care provider. °  °Document Released: 09/13/2005 Document Revised: 12/10/2014 Document Reviewed: 06/22/2014 °Elsevier Interactive Patient   Education ©2016 [employer]vier Inc. ° °Emergency Department Resource Guide °1) Find a Doctor and Pay Out of Pocket °Although you won't have to find out who is covered by your insurance  plan, it is a good idea to ask around and get recommendations. You will then need to call the office and see if the doctor you have chosen will accept you as a new patient and what types of options they offer for patients who are self-pay. Some doctors offer discounts or will set up payment plans for their patients who do not have insurance, but you will need to ask so you aren't surprised when you get to your appointment. ° °2) Contact Your Local Health Department °Not all health departments have doctors that can see patients for sick visits, but many do, so it is worth a call to see if yours does. If you don't know where your local health department is, you can check in your phone book. The CDC also has a tool to help you locate your state's health department, and many state websites also have listings of all of their local health departments. ° °3) Find a Walk-in Clinic °If your illness is not likely to be very severe or complicated, you may want to try a walk in clinic. These are popping up all over the country in pharmacies, drugstores, and shopping centers. They're usually staffed by nurse practitioners or physician assistants that have been trained to treat common illnesses and complaints. They're usually fairly quick and inexpensive. However, if you have serious medical issues or chronic medical problems, these are probably not your best option. ° °No Primary Care Doctor: °- Call [facility] at  832-8000 - they can help you locate a primary care doctor that  accepts your insurance, provides certain services, etc. °- Physician Referral Service- 1-800-533-3463 ° °Chronic Pain Problems: °Organization         Address  Phone   Notes  °[facility]  (336) 297-2271 Patients need to be referred by their primary care doctor.  ° °Medication Assistance: °Organization         Address  Phone   Notes  °[place] [facility] [address] °[place], [place] [address]  (336) 641-8030 --Must be a resident of [place] °-- Must have NO insurance coverage whatsoever (no Medicaid/ Medicare, etc.) °-- The pt. MUST have a primary care doctor that directs their care regularly and follows them in the community °  °[facility]  (866) 331-1348   °[employer]  (888) 892-1162   ° °Agencies that provide inexpensive medical care: °Organization         Address  Phone   Notes  °[facility]  (336) 832-8035   °[facility]    (336) 832-7272   °[facility] [address] °[place], [place] [address] (336) 832-4777   °[facility] [address], °[place] (336) 271-4999   °[facility]    (336) 373-0678   °[facility]    (336) 272-1050   °[facility] ° [address], [place] Phone:  (336) 832-4444, Fax:  (336) 832-4440 Hours of Operation:  9 am - 6 pm, M-F.  Also accepts Medicaid/Medicare and self-pay.  °[facility] ° [address], [place] Phone: (336) [phone], Fax: (336) [phone]. Hours of Operation:  8:30 am - 5:30 pm, M-F.  Also accepts Medicaid and   self-pay.  °[facility] [place] [address], [place] Phone: (336) 878-6027   °[facility] [address], [place], [place] (336)723-1848, Ext. 123 Mondays & Thursdays: 7-9 AM.  First 15 patients are seen on a first come, first serve basis. °  ° °Medicaid-accepting [place] Providers: ° °Organization         Address  Phone   Notes  °[facility] [address], [place] (336) 641-2100 Also accepts self-pay patients.  °[facility] [address], [place] ° (336) 856-9996   °[facility] [address], [place] (336) 288-8857   °[facility] [address] [place] [address], [place] (336) 299-7000   °Veita Bland [address], [place]  ° (336)  373-1557 Only accepts [place] Access Medicaid patients after they have their name applied to their card.  ° °Self-Pay (no insurance) in [place]: ° °Organization         Address  Phone   Notes  °Sickle Cell Patients, [facility] [address], [place] (336) 832-1970   °[facility] [address], [place] (336) 832-4400   ° [facility] ° [address], [place] (336) 992-4800   °[facility]/Dr. Osei-Bonsu ° [address] [place] [address], [place] or [address], [place] (336) [phone] Phone number for both [place] and [place] locations is the same.  °[facility] [address], [place] (336) 299-0000   °[facility] [address] [place] [address], [place] or [address] (336) 852-7530 °(336) 878-2260   °[facility] [address], [place] (336) 350-1642, phone; (336) 294-5005, fax Sees patients 1st and 3rd Saturday of every month.  Must not qualify for public or private insurance (i.e. Medicaid, Medicare, [place] Health Choice, Veterans' Benefits) • Household income should be no more than 200% of the poverty level •The clinic cannot treat you if you are pregnant or think you are pregnant • Sexually transmitted diseases are not treated at the clinic.  ° ° °Dental Care: °Organization         Address  Phone  Notes  °[facility] [address], [place] (336) 641-6152 Accepts children up to age 21 who are enrolled in Medicaid or [place] Health Choice; pregnant women with a Medicaid card; and children who have applied for Medicaid or [place] Health Choice, but were declined, whose parents can pay a reduced fee at time of service.  °[facility] [place]  [address], [place] (336) 641-7733 Accepts children up to age 21 who are enrolled in Medicaid or [place] Health  Choice; pregnant women with a Medicaid card; and children who have applied for Medicaid or [place] Health Choice, but were declined, whose parents can pay a reduced fee at time of service.  °[facility] ° [address],  (336) 641-4533 Patients are seen by appointment only. Walk-ins are not accepted. [facility] will see patients 18 years of age and older. °Monday - Tuesday (8am-5pm) °Most Wednesdays (8:30-5pm) °$30 per visit, cash only  °[facility] ° [address], [place] (336) 641-4533 Patients are seen by appointment only. Walk-ins are not accepted. [facility] will see patients 18   years of age and older. °One Wednesday Evening (Monthly: Volunteer Based).  $30 per visit, cash only  °[facility]  (919) [phone] for adults; Children under age 4, call Graduate Pediatric Dentistry at ([phone]) [phone]. Children aged 4-14, please call ([phone]) [phone] to request a pediatric application. ° Dental services are provided in all areas of dental care including fillings, crowns and bridges, complete and partial dentures, implants, gum treatment, root canals, and extractions. Preventive care is also provided. Treatment is provided to both adults and children. °Patients are selected via a lottery and there is often a waiting list. °  °[facility] [address], °[place] ° (336) 763-8833 [email or web address] °  °[facility] [address], [place], [place] (336)723-1848, Ext. 123 Second and Fourth Thursday of each month, opens at 6:30 AM; Clinic ends at 9 AM.  Patients are seen on a first-come first-served basis, and a limited number are seen during each clinic.  ° °[facility] ° [address], [place], [place] (336) 723-7904   Eligibility Requirements °You must have lived in [place], [place], or [place] counties for at least the last three months. °  You cannot be eligible for state or  federal sponsored healthcare insurance, including Veterans Administration, Medicaid, or Medicare. °  You generally cannot be eligible for healthcare insurance through your employer.  °  How to apply: °Eligibility screenings are held every Tuesday and Wednesday afternoon from 1:00 pm until 4:00 pm. You do not need an appointment for the interview!  °[facility] [address], [place], [place] 336-631-2330   °[facility]  336-342-8273   °[facility]  336-703-3100   °[facility]  336-570-6415   ° °Behavioral Health Resources in the Community: °Intensive Outpatient Programs °Organization         Address  Phone  Notes  °[facility] [address], [place], [place] 336-878-6098   °[facility] [address], [place], [place] 336-832-9800   °[facility] [address], [place], [place] ° 336-882-2125   °[facility] [address],  °[place], [place] 1-800-853-5163 or 336-641-4981   °Substance Abuse Resources °Organization         Address  Phone  Notes  °[facility]  336-882-2125   °[facility]  336-784-9470   °[facility]  336-285-9073   °[facility]  336-845-3988   °Residential & Outpatient Substance Abuse Program  1-800-659-3381   °Psychological Services °Organization         Address  Phone  Notes  °[facility]  336- 832-9600   °[facility]  336- 378-7881   °[facility] [address], [place] 1-800-853-5163 or 336-641-4981   ° °Mobile Crisis Teams °Organization         Address  Phone  Notes  °[facility], Mobile Crisis Care Unit  1-877-626-1772   °Assertive °[facility] ° [address] [place], [place] 336-834-9664   °Sharon DeEsch [address] °[place]  336-554-5454   ° °Self-Help/Support Groups °Organization         Address  Phone              Notes  °[facility] - variety of support groups  336- 373-1402 Call for more information  °Narcotics Anonymous (NA), [facility] [address], °[place]     2 meetings at this location  ° °Residential Treatment Programs °Organization         Address  Phone  Notes  °[facility] [address],    °[place]  1-866-801-8205   °[facility] ° [address], [place], [place] 704-293-8524   °[facility] [address], [place] 336-845-3988 Admissions: 8am-3pm M-F  °[facility] [address]    °[place], [place] 336-841-1104   °[facility] [address], [place], [place] 336-379-7146   °[facility] [address]  °[place], [place] 336-285-9073   °[facility] - Intensive Outpatient [address], [place], [place] 336-852-3033   °[facility] [address]  °[place], [place] 1-877-615-2722 or 336-784-9470   °[facility] [address] [place], [place] 336-227-7417 Accepts Medicaid  °[facility] [address]  °[place] 1-800-659-3381 Substance Abuse/Addiction Treatment  ° °[place] Behavioral Health Resources °Organization         Address  Phone  Notes  °[facility]  (888) 581-9988   °Julie Brannon, PhD [address] [place], [place]   (336) 349-5553 or (336) 951-0000   °[facility]   [address] °[place], [place] (336) 349-4454   °[facility] [address], [place], [place] (336) 342-8316 Insurance/Medicaid/sponsorship through [facility]  °[facility] [address]                                    [place], [place] (336) 342-8316 Therapy/tele-psych/case  °[facility] [address]  ° [place], [place] (336) 349-2233    °Dr. Arfeen  (336) 349-4544   °[facility] [place]  [employer] [place] [facility] 1) [address], [place] °2) [address], [place] °3)  [address], [place] (336) 349-3220 °(336) 342-7768 ° °(336) 342-8140   °[place] Child Abuse [facility] (336) 342-1394 or (336) 342-3537 (After Hours)    ° ° ° °
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
## 2015-11-04 ENCOUNTER — Emergency Department (HOSPITAL_COMMUNITY): Payer: Self-pay

## 2015-11-04 ENCOUNTER — Emergency Department (HOSPITAL_COMMUNITY)
Admission: EM | Admit: 2015-11-04 | Discharge: 2015-11-05 | Disposition: A | Discharge status: Home / Self Care | Payer: Self-pay | Attending: Emergency Medicine | Admitting: Emergency Medicine

## 2015-11-04 ENCOUNTER — Encounter (HOSPITAL_COMMUNITY): Payer: Self-pay | Admitting: *Deleted

## 2015-11-04 DIAGNOSIS — Y9389 Activity, other specified: Secondary | ICD-10-CM | POA: Insufficient documentation

## 2015-11-04 DIAGNOSIS — I10 Essential (primary) hypertension: Secondary | ICD-10-CM | POA: Insufficient documentation

## 2015-11-04 DIAGNOSIS — S4992XA Unspecified injury of left shoulder and upper arm, initial encounter: Secondary | ICD-10-CM | POA: Insufficient documentation

## 2015-11-04 DIAGNOSIS — S8992XA Unspecified injury of left lower leg, initial encounter: Secondary | ICD-10-CM | POA: Insufficient documentation

## 2015-11-04 DIAGNOSIS — W1839XA Other fall on same level, initial encounter: Secondary | ICD-10-CM | POA: Insufficient documentation

## 2015-11-04 DIAGNOSIS — Z8659 Personal history of other mental and behavioral disorders: Secondary | ICD-10-CM | POA: Insufficient documentation

## 2015-11-04 DIAGNOSIS — Y998 Other external cause status: Secondary | ICD-10-CM | POA: Insufficient documentation

## 2015-11-04 DIAGNOSIS — Y9289 Other specified places as the place of occurrence of the external cause: Secondary | ICD-10-CM | POA: Insufficient documentation

## 2015-11-04 DIAGNOSIS — Z79899 Other long term (current) drug therapy: Secondary | ICD-10-CM | POA: Insufficient documentation

## 2015-11-04 DIAGNOSIS — F17210 Nicotine dependence, cigarettes, uncomplicated: Secondary | ICD-10-CM | POA: Insufficient documentation

## 2015-11-04 DIAGNOSIS — E320 Persistent hyperplasia of thymus: Secondary | ICD-10-CM | POA: Insufficient documentation

## 2015-11-04 HISTORY — DX: Reserved for inherently not codable concepts without codable children: IMO0001

## 2015-11-04 LAB — CBC WITH DIFFERENTIAL/PLATELET
BASOS ABS: 0 10*3/uL (ref 0.0–0.1)
Basophils Relative: 0 %
EOS ABS: 0.3 10*3/uL (ref 0.0–0.7)
Eosinophils Relative: 4 %
HCT: 38.6 % — ABNORMAL LOW (ref 39.0–52.0)
HEMOGLOBIN: 14.1 g/dL (ref 13.0–17.0)
LYMPHS ABS: 3 10*3/uL (ref 0.7–4.0)
LYMPHS PCT: 44 %
MCH: 30.3 pg (ref 26.0–34.0)
MCHC: 36.5 g/dL — ABNORMAL HIGH (ref 30.0–36.0)
MCV: 83 fL (ref 78.0–100.0)
Monocytes Absolute: 1 10*3/uL (ref 0.1–1.0)
Monocytes Relative: 15 %
NEUTROS PCT: 37 %
Neutro Abs: 2.6 10*3/uL (ref 1.7–7.7)
Platelets: 203 10*3/uL (ref 150–400)
RBC: 4.65 MIL/uL (ref 4.22–5.81)
RDW: 11.8 % (ref 11.5–15.5)
WBC: 6.8 10*3/uL (ref 4.0–10.5)

## 2015-11-04 LAB — BASIC METABOLIC PANEL
ANION GAP: 6 (ref 5–15)
BUN: 10 mg/dL (ref 6–20)
CALCIUM: 9.4 mg/dL (ref 8.9–10.3)
CHLORIDE: 105 mmol/L (ref 101–111)
CO2: 27 mmol/L (ref 22–32)
Creatinine, Ser: 0.64 mg/dL (ref 0.61–1.24)
GFR calc non Af Amer: >60 mL/min (ref >60)
Glucose, Bld: 101 mg/dL — ABNORMAL HIGH (ref 65–99)
Potassium: 3.8 mmol/L (ref 3.5–5.1)
Sodium: 138 mmol/L (ref 135–145)

## 2015-11-04 LAB — TROPONIN I: Troponin I: <0.03 ng/mL (ref <0.031)

## 2015-11-04 MED ORDER — NITROGLYCERIN 0.4 MG SL SUBL
0.4000 mg | SUBLINGUAL_TABLET | SUBLINGUAL | Status: DC | PRN
  Administered 2015-11-04 (×2): 0.4 mg via SUBLINGUAL
  Filled 2015-11-04: qty 1

## 2015-11-04 MED ORDER — FENTANYL CITRATE (PF) 100 MCG/2ML IJ SOLN
100.0000 ug | Freq: Once | INTRAMUSCULAR | Status: AC
  Administered 2015-11-04: 100 ug via INTRAVENOUS
  Filled 2015-11-04: qty 2

## 2015-11-04 MED ORDER — IOHEXOL 350 MG/ML SOLN
100.0000 mL | Freq: Once | INTRAVENOUS | Status: AC | PRN
  Administered 2015-11-04: 100 mL via INTRAVENOUS

## 2015-11-04 NOTE — ED Notes (Signed)
Mid CP with left jaw pain as well for about 35 min. Pt in jail, 1 SL NTG and 324 mg ASA given, 18 G IV , hx of MI per report

## 2015-11-04 NOTE — ED Provider Notes (Signed)
CSN: [id number]     Arrival date & time 11/04/15  2019 History  By signing my name below, I, Arianna Nassar, attest that this documentation has been prepared under the direction and in the presence of Marily Memos, MD. Electronically Signed: Octavia Heir, ED Scribe. 11/04/2015. 9:19 PM.    Chief Complaint  Patient presents with  . Chest Pain      The history is provided by the patient. No language interpreter was used.   HPI Comments: Steven Bradshaw is a 39 y.o. male who has a hx of HTN and alcoholism presents to the Emergency Department complaining of intermittent, moderate, "6/10" gradual worsening generalized chest pain that radiates down his left arm and left leg onset about 2 hours ago. He currently has shortness of breath. Pt is currently in jail and states he started having chest pain after he ate dinner about two hours ago. He had associated left leg pain and he went back into is cell and reports having left arm pain and notes he fell to the ground. Pt has a hx of having similar chest pain in the past. He was given nitroglycerin via EMS and he states it helped with his chest pain minimally. He reports the last time he consumed alcohol was Saturday. He denies cough, fever, rash, leg swelling, loss of appetite, trauma to chest and recent drug use.  Pt is a smoker.  Past Medical History  Diagnosis Date  . Hypertension   . Alcoholism /alcohol abuse (HCC)   . Suicidal ideation   . Normal cardiac stress test 04/2015    low risk   History reviewed. No pertinent past surgical history. History reviewed. No pertinent family history. Social History  Substance Use Topics  . Smoking status: Current Every Day Smoker    Types: Cigarettes  . Smokeless tobacco: None  . Alcohol Use: Yes     Comment: daily, heavily-     Review of Systems  Constitutional: Negative for fever and appetite change.  Respiratory: Positive for shortness of breath. Negative for cough.   Cardiovascular: Positive  for chest pain. Negative for leg swelling.  Gastrointestinal: Negative for abdominal pain.  Musculoskeletal: Positive for arthralgias.  Skin: Negative for rash.  All other systems reviewed and are negative.     Allergies  Bee venom  Home Medications   Prior to Admission medications   Medication Sig Start Date End Date Taking? Authorizing Provider  chlordiazePOXIDE (LIBRIUM) 10 MG capsule Take 10 mg by mouth 2 (two) times daily.   Yes Historical Provider, MD  loratadine (CLARITIN) 10 MG tablet Take 10 mg by mouth daily as needed for allergies.   Yes Historical Provider, MD   Triage vitals: BP 150/80 mmHg  Temp(Src) 98.1 F (36.7 C) (Oral)  Resp 20  Ht  (1.778 m)  Wt 204 lb (92.534 kg)  BMI 29.27 kg/m2  SpO2 98% Physical Exam  Constitutional: He is oriented to person, place, and time. He appears well-developed and well-nourished.  HENT:  Head: Normocephalic and atraumatic.  Eyes: EOM are normal.  Neck: Normal range of motion.  Cardiovascular: Normal rate, regular rhythm, normal heart sounds and intact distal pulses.   Pulmonary/Chest: Effort normal and breath sounds normal. No respiratory distress. He exhibits tenderness.  Point tenderness in left sternal area  Abdominal: Soft. He exhibits no distension. There is no tenderness.  Musculoskeletal: Normal range of motion.  Neurological: He is alert and oriented to person, place, and time.  Skin: Skin is warm and  dry.  Psychiatric: He has a normal mood and affect. Judgment normal.  Nursing note and vitals reviewed.   ED Course  Procedures  DIAGNOSTIC STUDIES: Oxygen Saturation is 98% on RA, normal by my interpretation.  COORDINATION OF CARE:  9:18 PM Discussed treatment plan with pt at bedside and pt agreed to plan.  Labs Review Labs Reviewed  BASIC METABOLIC PANEL - Abnormal; Notable for the following:    Glucose, Bld 101 (*)    All other components within normal limits  CBC WITH DIFFERENTIAL/PLATELET -  Abnormal; Notable for the following:    HCT 38.6 (*)    MCHC 36.5 (*)    All other components within normal limits  TROPONIN I  TROPONIN I    Imaging Review Dg Chest 2 View  11/04/2015  CLINICAL DATA:  Postprandial chest pain tonight. EXAM: CHEST  2 VIEW COMPARISON:  03/24/2015 FINDINGS: Minimal linear scarring or atelectasis in the lateral left base. The lungs are otherwise clear. There is no pleural effusion. Hilar, mediastinal and cardiac contours are unremarkable. Pulmonary vasculature is normal. IMPRESSION: No active cardiopulmonary disease. Electronically Signed   By: Ellery Plunk M.D.   On: 11/04/2015 21:03   Ct Angio Chest Pe W/cm &/or Wo Cm  11/04/2015  CLINICAL DATA:  Shortness of breath. Chest pain. Left arm pain. Fell. EXAM: CT ANGIOGRAPHY CHEST WITH CONTRAST TECHNIQUE: Multidetector CT imaging of the chest was performed using the standard protocol during bolus administration of intravenous contrast. Multiplanar CT image reconstructions and MIPs were obtained to evaluate the vascular anatomy. CONTRAST:  OMNIPAQUE IOHEXOL 350 MG/ML SOLN COMPARISON:  Chest radiographs obtained earlier today. Chest CTA dated 04/15/2015. FINDINGS: Mediastinum/Lymph Nodes: The previously demonstrated 2.9 x 2.1 cm left anterior superior mediastinal soft tissue mass currently measures 4.2 x 2.4 cm in corresponding dimensions on image number 39 of series 4. This extends to extend to the right and left in a more linear fashion, conforming to the anterior mediastinum. A mildly prominent right hilar lymph node is not changed significantly, with a short axis diameter of 12 mm on image number 44. The thyroid gland remains diffusely enlarged with no visible mass. Normally opacified pulmonary arteries with no pulmonary arterial filling defects. Lungs/Pleura: Minimal bilateral dependent atelectasis. No lung nodules. No pleural fluid. Upper abdomen: No acute findings. Musculoskeletal: Mild thoracic spine  degenerative changes. Review of the MIP images confirms the above findings. IMPRESSION: 1. No pulmonary emboli or acute abnormality. 2. Mild increase in prominence of a probable normal thymus gland. Although a portion of this has a mass-like appearance, the majority does not, making a mass such as a thymoma unlikely. 3. Stable mildly prominent right hilar lymph node. 4. Stable mild thyroid goiter without visible nodules. Electronically Signed   By: Beckie Salts M.D.   On: 11/04/2015 22:16   I have personally reviewed and evaluated these images and lab results as part of my medical decision-making.   EKG Interpretation   Date/Time:  Friday November 04 2015 20:25:57 EST Ventricular Rate:  108 PR Interval:  140 QRS Duration: 92 QT Interval:  335 QTC Calculation: 449 R Axis:   57 Text Interpretation:  Sinus tachycardia Borderline T abnormalities,  diffuse leads inverted t waves similar to previous ecg 03/24/15 Confirmed  by [facility] MD, Barbara Cower [id number]) on 11/04/2015 8:42:25 PM      MDM   Final diagnoses:  Hypertrophy thymus (HCC)   Atypical chest pain for ACS with a normal EKG and negative troponins. Symptoms May be related  to this enlarging thymus that he has on CT scan. No evidence of PE on CT scan no evidence of pneumonia, pneumothorax or broken ribs. No evidence of pericardial effusion. We'll suggest outpatient follow-up quickly for reevaluation of his enlarged thymus and further management. Otherwise released to [place] custody [place].    I personally performed the services described in this documentation, which was scribed in my presence. The recorded information has been reviewed and is accurate.    Marily MemosJason Adasha Boehme, MD 11/05/15 [phone]

## 2015-11-05 LAB — TROPONIN I: Troponin I: <0.03 ng/mL (ref <0.031)

## 2017-12-03 DIAGNOSIS — E0590 Thyrotoxicosis, unspecified without thyrotoxic crisis or storm: Secondary | ICD-10-CM

## 2017-12-03 HISTORY — DX: Thyrotoxicosis, unspecified without thyrotoxic crisis or storm: E05.90

## 2018-02-21 ENCOUNTER — Encounter (HOSPITAL_COMMUNITY): Payer: Self-pay | Admitting: Cardiology

## 2018-02-21 ENCOUNTER — Emergency Department (HOSPITAL_COMMUNITY): Payer: Self-pay

## 2018-02-21 ENCOUNTER — Other Ambulatory Visit: Payer: Self-pay

## 2018-02-21 ENCOUNTER — Inpatient Hospital Stay (HOSPITAL_COMMUNITY): Payer: Self-pay

## 2018-02-21 ENCOUNTER — Inpatient Hospital Stay (HOSPITAL_COMMUNITY)
Admission: EM | Admit: 2018-02-21 | Discharge: 2018-02-23 | DRG: 392 | Disposition: A | Discharge status: Home / Self Care | Payer: Self-pay | Attending: Internal Medicine | Admitting: Internal Medicine

## 2018-02-21 DIAGNOSIS — Z91030 Bee allergy status: Secondary | ICD-10-CM

## 2018-02-21 DIAGNOSIS — Z720 Tobacco use: Secondary | ICD-10-CM

## 2018-02-21 DIAGNOSIS — Z8249 Family history of ischemic heart disease and other diseases of the circulatory system: Secondary | ICD-10-CM

## 2018-02-21 DIAGNOSIS — R0789 Other chest pain: Secondary | ICD-10-CM

## 2018-02-21 DIAGNOSIS — F17210 Nicotine dependence, cigarettes, uncomplicated: Secondary | ICD-10-CM | POA: Diagnosis present

## 2018-02-21 DIAGNOSIS — I10 Essential (primary) hypertension: Secondary | ICD-10-CM | POA: Diagnosis present

## 2018-02-21 DIAGNOSIS — F1010 Alcohol abuse, uncomplicated: Secondary | ICD-10-CM

## 2018-02-21 DIAGNOSIS — K219 Gastro-esophageal reflux disease without esophagitis: Principal | ICD-10-CM | POA: Diagnosis present

## 2018-02-21 DIAGNOSIS — R079 Chest pain, unspecified: Secondary | ICD-10-CM | POA: Diagnosis present

## 2018-02-21 DIAGNOSIS — E785 Hyperlipidemia, unspecified: Secondary | ICD-10-CM | POA: Diagnosis present

## 2018-02-21 DIAGNOSIS — Z9114 Patient's other noncompliance with medication regimen: Secondary | ICD-10-CM

## 2018-02-21 DIAGNOSIS — R0989 Other specified symptoms and signs involving the circulatory and respiratory systems: Secondary | ICD-10-CM

## 2018-02-21 DIAGNOSIS — F1020 Alcohol dependence, uncomplicated: Secondary | ICD-10-CM | POA: Diagnosis present

## 2018-02-21 LAB — BASIC METABOLIC PANEL
ANION GAP: 12 (ref 5–15)
BUN: 10 mg/dL (ref 6–20)
CALCIUM: 9.4 mg/dL (ref 8.9–10.3)
CO2: 22 mmol/L (ref 22–32)
Chloride: 105 mmol/L (ref 101–111)
Creatinine, Ser: 0.57 mg/dL — ABNORMAL LOW (ref 0.61–1.24)
GFR calc Af Amer: >60 mL/min (ref >60)
Glucose, Bld: 96 mg/dL (ref 65–99)
POTASSIUM: 3.6 mmol/L (ref 3.5–5.1)
SODIUM: 139 mmol/L (ref 135–145)

## 2018-02-21 LAB — CBC WITH DIFFERENTIAL/PLATELET
BASOS ABS: 0 10*3/uL (ref 0.0–0.1)
BASOS PCT: 0 %
EOS PCT: 4 %
Eosinophils Absolute: 0.2 10*3/uL (ref 0.0–0.7)
HCT: 40.1 % (ref 39.0–52.0)
Hemoglobin: 14.1 g/dL (ref 13.0–17.0)
LYMPHS PCT: 36 %
Lymphs Abs: 2.1 10*3/uL (ref 0.7–4.0)
MCH: 28.8 pg (ref 26.0–34.0)
MCHC: 35.2 g/dL (ref 30.0–36.0)
MCV: 81.8 fL (ref 78.0–100.0)
Monocytes Absolute: 0.7 10*3/uL (ref 0.1–1.0)
Monocytes Relative: 11 %
NEUTROS ABS: 2.8 10*3/uL (ref 1.7–7.7)
Neutrophils Relative %: 49 %
PLATELETS: 209 10*3/uL (ref 150–400)
RBC: 4.9 MIL/uL (ref 4.22–5.81)
RDW: 12 % (ref 11.5–15.5)
WBC: 5.8 10*3/uL (ref 4.0–10.5)

## 2018-02-21 LAB — HEPATIC FUNCTION PANEL
ALBUMIN: 3.8 g/dL (ref 3.5–5.0)
ALT: 59 U/L (ref 17–63)
AST: 43 U/L — AB (ref 15–41)
Alkaline Phosphatase: 114 U/L (ref 38–126)
BILIRUBIN DIRECT: 0.3 mg/dL (ref 0.1–0.5)
Indirect Bilirubin: 1.2 mg/dL — ABNORMAL HIGH (ref 0.3–0.9)
Total Bilirubin: 1.5 mg/dL — ABNORMAL HIGH (ref 0.3–1.2)
Total Protein: 7.4 g/dL (ref 6.5–8.1)

## 2018-02-21 LAB — MAGNESIUM: MAGNESIUM: 1.6 mg/dL — AB (ref 1.7–2.4)

## 2018-02-21 LAB — TROPONIN I
TROPONIN I: 0.04 ng/mL — AB (ref <0.03)
TROPONIN I: 0.04 ng/mL — AB (ref <0.03)
Troponin I: 0.04 ng/mL (ref <0.03)

## 2018-02-21 LAB — PHOSPHORUS: Phosphorus: 3.1 mg/dL (ref 2.5–4.6)

## 2018-02-21 LAB — HEMOGLOBIN A1C
Hgb A1c MFr Bld: 4.7 % — ABNORMAL LOW (ref 4.8–5.6)
Mean Plasma Glucose: 88.19 mg/dL

## 2018-02-21 LAB — D-DIMER, QUANTITATIVE: D-Dimer, Quant: 0.4 ug/mL-FEU (ref 0.00–0.50)

## 2018-02-21 MED ORDER — LORAZEPAM 2 MG/ML IJ SOLN
0.0000 mg | Freq: Four times a day (QID) | INTRAMUSCULAR | Status: AC
  Administered 2018-02-22: 1 mg via INTRAVENOUS
  Filled 2018-02-21 (×2): qty 1

## 2018-02-21 MED ORDER — ACETAMINOPHEN 325 MG PO TABS: 650.0000 mg | ORAL_TABLET | ORAL | Status: DC | PRN

## 2018-02-21 MED ORDER — LORAZEPAM 1 MG PO TABS
1.0000 mg | ORAL_TABLET | Freq: Four times a day (QID) | ORAL | Status: DC | PRN
  Administered 2018-02-21: 1 mg via ORAL
  Filled 2018-02-21: qty 1

## 2018-02-21 MED ORDER — NICOTINE 21 MG/24HR TD PT24
21.0000 mg | MEDICATED_PATCH | Freq: Every day | TRANSDERMAL | Status: DC
  Filled 2018-02-21 (×2): qty 1

## 2018-02-21 MED ORDER — LORAZEPAM 2 MG/ML IJ SOLN: 0.0000 mg | Freq: Two times a day (BID) | INTRAMUSCULAR | Status: DC

## 2018-02-21 MED ORDER — DILTIAZEM HCL 30 MG PO TABS
60.0000 mg | ORAL_TABLET | Freq: Four times a day (QID) | ORAL | Status: DC
  Administered 2018-02-21 – 2018-02-23 (×9): 60 mg via ORAL
  Filled 2018-02-21 (×11): qty 2

## 2018-02-21 MED ORDER — HEPARIN SODIUM (PORCINE) 5000 UNIT/ML IJ SOLN
5000.0000 [IU] | Freq: Three times a day (TID) | INTRAMUSCULAR | Status: DC
  Filled 2018-02-21 (×2): qty 1

## 2018-02-21 MED ORDER — ISOSORBIDE MONONITRATE ER 30 MG PO TB24: 30.0000 mg | ORAL_TABLET | Freq: Every day | ORAL | Status: DC

## 2018-02-21 MED ORDER — FOLIC ACID 1 MG PO TABS
1.0000 mg | ORAL_TABLET | Freq: Every day | ORAL | Status: DC
  Administered 2018-02-21 – 2018-02-23 (×3): 1 mg via ORAL
  Filled 2018-02-21 (×3): qty 1

## 2018-02-21 MED ORDER — GI COCKTAIL ~~LOC~~: 30.0000 mL | Freq: Four times a day (QID) | ORAL | Status: DC | PRN

## 2018-02-21 MED ORDER — THIAMINE HCL 100 MG/ML IJ SOLN
100.0000 mg | Freq: Every day | INTRAMUSCULAR | Status: DC
  Filled 2018-02-21: qty 2

## 2018-02-21 MED ORDER — ASPIRIN 81 MG PO CHEW
324.0000 mg | CHEWABLE_TABLET | Freq: Once | ORAL | Status: AC
  Administered 2018-02-21: 324 mg via ORAL
  Filled 2018-02-21: qty 4

## 2018-02-21 MED ORDER — MORPHINE SULFATE (PF) 2 MG/ML IV SOLN: 2.0000 mg | INTRAVENOUS | Status: DC | PRN

## 2018-02-21 MED ORDER — ASPIRIN EC 81 MG PO TBEC
81.0000 mg | DELAYED_RELEASE_TABLET | Freq: Every day | ORAL | Status: DC
  Administered 2018-02-21 – 2018-02-23 (×3): 81 mg via ORAL
  Filled 2018-02-21 (×3): qty 1

## 2018-02-21 MED ORDER — PANTOPRAZOLE SODIUM 40 MG PO TBEC
40.0000 mg | DELAYED_RELEASE_TABLET | Freq: Two times a day (BID) | ORAL | Status: DC
  Administered 2018-02-21 – 2018-02-23 (×5): 40 mg via ORAL
  Filled 2018-02-21 (×5): qty 1

## 2018-02-21 MED ORDER — LORAZEPAM 2 MG/ML IJ SOLN: 1.0000 mg | Freq: Four times a day (QID) | INTRAMUSCULAR | Status: DC | PRN

## 2018-02-21 MED ORDER — MAGNESIUM SULFATE 2 GM/50ML IV SOLN
2.0000 g | Freq: Once | INTRAVENOUS | Status: AC
  Administered 2018-02-21: 2 g via INTRAVENOUS
  Filled 2018-02-21: qty 50

## 2018-02-21 MED ORDER — VITAMIN B-1 100 MG PO TABS
100.0000 mg | ORAL_TABLET | Freq: Every day | ORAL | Status: DC
  Administered 2018-02-21 – 2018-02-23 (×3): 100 mg via ORAL
  Filled 2018-02-21 (×3): qty 1

## 2018-02-21 MED ORDER — ONDANSETRON HCL 4 MG/2ML IJ SOLN: 4.0000 mg | Freq: Four times a day (QID) | INTRAMUSCULAR | Status: DC | PRN

## 2018-02-21 MED ORDER — ADULT MULTIVITAMIN W/MINERALS CH
1.0000 | ORAL_TABLET | Freq: Every day | ORAL | Status: DC
  Administered 2018-02-21 – 2018-02-23 (×3): 1 via ORAL
  Filled 2018-02-21 (×3): qty 1

## 2018-02-21 MED ORDER — METOPROLOL TARTRATE 25 MG PO TABS: 25.0000 mg | ORAL_TABLET | Freq: Two times a day (BID) | ORAL | Status: DC

## 2018-02-21 NOTE — ED Triage Notes (Signed)
Chest pain since Wednesday.  Seen for same in the past

## 2018-02-21 NOTE — ED Notes (Signed)
Pt returned from xray

## 2018-02-21 NOTE — ED Notes (Signed)
Pt given Sprite zero and crackers per request.

## 2018-02-21 NOTE — H&P (Addendum)
History and Physical    Steven PontGregory D Cedar ONG:[id number] DOB: 05-19-1976 DOA: 02/21/2018  PCP: Patient, No Pcp Per   I have briefly reviewed [facility].  Patient coming from: home  Chief Complaint: chest pain  HPI: Steven Bradshaw is a 42 y/o male with PMH significant for HTN, HLD, tobacco abuse and alcohol abuse; who presented to ED with complaints of CP. Patient's pain started on Wednesday (3/20), localized on the left side of his chest, with associated nausea and vomiting, no SOB, no palpitations. Pain lasted until 3/22 morning (spontaneously resolved). Patient reported pain was 9-10 in intensity and knife stabbing sensation. Nothing made pain better; movement and deep breath.  Patient denies palpitation, hematemesis, focal deficit, dysuria, hematuria, abd pain, melena or hematochezia. Also no fever, no chills and no cough.   ED Course: in ED EKG with sinus tachycardia, normal rhythm, and T wave inversions on inferior leads; CXR w/o acute infiltrates. Troponin elevated a 0.04 and elevated BP 160's/115. [facility] called to admit patient for further evaluation and treatment. Cardiology consulted.  Review of Systems:  All other systems reviewed and apart from HPI, are negative.  Past Medical History:  Diagnosis Date  . Alcoholism /alcohol abuse (HCC)   . Hypertension   . Normal cardiac stress test 04/2015   low risk  . Suicidal ideation     History reviewed.  -No pertinent surgical history.  Social History  reports that he has been smoking cigarettes.  He has never used smokeless tobacco. He reports that he drinks alcohol. He reports that he has current or past drug history. Drug: Cocaine.  Allergies  Allergen Reactions  . Bee Venom Anaphylaxis   FMH: -significant for HTN and family hx of CAD on his dad   Prior to Admission medications   Not on File    Physical Exam: Vitals:   02/21/18 1045 02/21/18 1100 02/21/18 1115 02/21/18 1130    BP: (!) 158/91 (!) 177/93 (!) 161/79 (!) 166/81  Pulse: 99 99 95 96  Resp: (!) 35 20 (!) 24 (!) 27  Temp:      TempSrc:      SpO2: 98% 96% 98% 98%  Weight:      Height:          Constitutional: afebrile, no complaining of nausea, vomiting, palpitation of active CP currently. Eyes: PERTLA, lids and conjunctivae normal, no icterus. ENMT: Mucous membranes are moist. Posterior pharynx clear of any exudate or lesions. No thrush Neck: supple, no masses, no thyromegaly, no JVD; positive right bruit. Respiratory: clear to auscultation bilaterally, no wheezing, no crackles. Normal respiratory effort. No accessory muscle use.  Cardiovascular: tachycardic, loud impulses, no rubs, no gallops, no murmurs.  Abdomen: No distension, no tenderness, no masses palpated. No hepatosplenomegaly. Bowel sounds normal.  Musculoskeletal: no clubbing / cyanosis. No joint deformity upper and lower extremities. Good ROM, no contractures. Normal muscle tone.  Skin: no rashes, lesions, ulcers. No induration Neurologic: CN 2-12 grossly intact. Sensation intact, DTR normal. Strength 5/5 in all 4 limbs.  Psychiatric: Normal judgment and insight. Alert and oriented x 3. Normal mood.   Labs on Admission: I have personally reviewed following labs and imaging studies  CBC: Recent Labs  Lab 02/21/18 0906  WBC 5.8  NEUTROABS 2.8  HGB 14.1  HCT 40.1  MCV 81.8  PLT 209   Basic Metabolic Panel: Recent Labs  Lab 02/21/18 0906  NA 139  K 3.6  CL 105  CO2  22  GLUCOSE 96  BUN 10  CREATININE 0.57*  CALCIUM 9.4   Cardiac Enzymes: Recent Labs  Lab 02/21/18 0906  TROPONINI 0.04*    Radiological Exams on Admission: Dg Chest 2 View  Result Date: 02/21/2018 CLINICAL DATA:  Chest pain and intermittent shortness of breath since 02/19/2018. EXAM: CHEST - 2 VIEW COMPARISON:  PA and lateral chest CT chest 11/04/2015. FINDINGS: The lungs are clear. Heart size is normal. No pneumothorax or pleural fluid. No acute  bony abnormality. IMPRESSION: No acute disease. Electronically Signed   By: Drusilla Kanner M.D.   On: 02/21/2018 09:48    EKG:  T wave inversion on inferior lead, normal axis, sinus tachycardia.  Assessment/Plan 1-Chest pain with moderate risk of acute coronary syndrome -troponin mild elevated -long discussion regarding medication compliance and risk factors modifications -plan to start hime on lopressor and imdur; but following cardiology rec's and with concerns for B-blocker rebound HTN/tachycardia after non-compliance decision was made to use cardizem instead -will cycle troponin and EKG -check 2-D echo -follow BP response and further adjust antihypertensive regimen  -will check A1C and lipid panel -PRN morphine and oxygen ordered  -patient started on a baby aspirin daily.  2-GERD -given heavy drinking and part of history reported, gastritis and esophagitis are high in the differential -will treat with PPI BID -also started on RPN GI cocktail for indigestion   3-tobacco abuse -cessation counseling provided -started on nicotine patch   4-alcohol abuse -will start patient on CIWA protocol  -patient drinks on daily basis -check LFT's, Mg and PO4 level -alcohol cessation provided -folic acid and thiamine started  5-HLD -not taking any meds currently -will check lipid panel  6-HTN -uncontrolled -not using any meds currently -following cardiology rec's started on cardizem  -heart healthy diet ordered   7-Non compliance w medication regimen -long discussion regarding importance with medications compliance. -patient expressed needs of assistance with acquiring his meds at discharge  8-right carotid bruit -will check carotid dopplers -started on ASA  Time: 65 minutes   DVT prophylaxis: heparin  Code Status: Full Family Communication: sister at bedside  Disposition Plan: to be determine; but anticipate discharge back home with outpatient PCP establishment and  follow up. Consults called: cardiology  Admission status: inpatient, LOS > 2 midnights, telemetry bed   Vassie Loll MD [facility] Pager [phone]  If 7PM-7AM, please contact night-coverage [email or web address] Password [facility]  02/21/2018, 12:34 PM

## 2018-02-21 NOTE — Consult Note (Addendum)
Cardiology Consultation:   Patient ID: Steven Bradshaw; [id number]; [birth year]   Admit date: 02/21/2018 Date of Consult: 02/21/2018  Primary Care Provider: Patient, No Pcp Per Primary Cardiologist: New   Patient Profile:   Steven Bradshaw is a 42 y.o. male with a hx of HTN and EtoH use  who is being seen today for the evaluation of CP  at the request of Dr Rennis Chris  History of Present Illness:   Steven Bradshaw who has a history of HTN and CP    Presetned to ED the AM with CP that began Wednesday   He says that he has had this intermitt for 3 years   He was seen in 2016 with CP (cant remember all of details)  Admitted to [facility]  R/O for MI   Echo normal   Stress test normal   CT scan showed thymus prominently   On review some coronary calcificaiton.s  The pt works as an Arts administrator .  Says on Wed developed CP (like something big sitting on me)   Not pleuritic  Got better with standing / raising arms   Says that activity did increase some    Pain was continuous Wed and Thursdays Came to ED today   Pain eased offf   Now back   Mild  Pt continues to smoke tobacco  Denies cocaine use   Drinks intermitt    In ED D Dimer elevated at 1.58   Trop 0.04   Past Medical History:  Diagnosis Date  . Alcoholism /alcohol abuse (HCC)   . Hypertension   . Normal cardiac stress test 04/2015   low risk  . Suicidal ideation     History reviewed. No pertinent surgical history.     Inpatient Medications: Scheduled Meds:  Continuous Infusions:  PRN Meds:   Allergies:    Allergies  Allergen Reactions  . Bee Venom Anaphylaxis    Social History:   Social History   Socioeconomic History  . Marital status: Single    Spouse name: Not on file  . Number of children: Not on file  . Years of education: Not on file  . Highest education level: Not on file  Occupational History  . Not on file  Social Needs  . Financial resource strain: Not on file  . Food insecurity:    Worry: Not on file   Inability: Not on file  . Transportation needs:    Medical: Not on file    Non-medical: Not on file  Tobacco Use  . Smoking status: Current Every Day Smoker    Types: Cigarettes  . Smokeless tobacco: Never Used  Substance and Sexual Activity  . Alcohol use: Yes    Comment: has not drink in 2 weeks.   . Drug use: Yes    Types: Cocaine    Comment: "long time ago"  . Sexual activity: Not on file  Lifestyle  . Physical activity:    Days per week: Not on file    Minutes per session: Not on file  . Stress: Not on file  Relationships  . Social connections:    Talks on phone: Not on file    Gets together: Not on file    Attends religious service: Not on file    Active member of club or organization: Not on file    Attends meetings of clubs or organizations: Not on file    Relationship status: Not on file  . Intimate partner violence:    Fear of  current or ex partner: Not on file    Emotionally abused: Not on file    Physically abused: Not on file    Forced sexual activity: Not on file  Other Topics Concern  . Not on file  Social History Narrative  . Not on file    Family History:   FHX signif for CAD    ROS:  Please see the history of present illness. Has had a cough productive of greenish sputm  Never fully recovered from URI this winter   No fevers    All other ROS reviewed and negative.     Physical Exam/Data:   Vitals:   02/21/18 1015 02/21/18 1030 02/21/18 1045 02/21/18 1100  BP: (!) 163/85 (!) 176/80 (!) 158/91 (!) 177/93  Pulse: (!) 102 (!) 103 99 99  Resp: (!) 21 (!) 24 (!) 35 20  Temp:      TempSrc:      SpO2: 98% 96% 98% 96%  Weight:      Height:       No intake or output data in the 24 hours ending 02/21/18 1141 Filed Weights   02/21/18 0840  Weight: 180 lb (81.6 kg)   Body mass index is 25.83 kg/m.  General:  Well nourished, well developed, in no acute distress HEENT: normal Lymph: no adenopathy Neck: no JVD   Endocrine:  No  thryomegaly Vascular: R  carotid bruit; FA pulses 2+ bilaterally without bruits  Cardiac:  normal S1, S2; RRR; no murmur  Lungs:  clear to auscultation bilaterally, no wheezing, rhonchi or rales  Abd: soft, nontender, no hepatomegaly  Ext: no edema Musculoskeletal:  No deformities, BUE and BLE strength normal and equal Skin: warm and dry  Neuro:  CNs 2-12 intact, no focal abnormalities noted Psych:  Normal affect   EKG:  The EKG was personally reviewed and demonstrates: ST 114 bpm   T wave inversion inferiorly Telemetry:  Telemetry was personally reviewed and demonstrates:  SR/ST    Relevant CV Studies:  Laboratory Data:  Chemistry Recent Labs  Lab 02/21/18 0906  NA 139  K 3.6  CL 105  CO2 22  GLUCOSE 96  BUN 10  CREATININE 0.57*  CALCIUM 9.4  GFRNONAA >60  GFRAA >60  ANIONGAP 12    No results for input(s): PROT, ALBUMIN, AST, ALT, ALKPHOS, BILITOT in the last 168 hours. Hematology Recent Labs  Lab 02/21/18 0906  WBC 5.8  RBC 4.90  HGB 14.1  HCT 40.1  MCV 81.8  MCH 28.8  MCHC 35.2  RDW 12.0  PLT 209   Cardiac Enzymes Recent Labs  Lab 02/21/18 0906  TROPONINI 0.04*   No results for input(s): TROPIPOC in the last 168 hours.  BNPNo results for input(s): BNP, PROBNP in the last 168 hours.  DDimer  Recent Labs  Lab 02/21/18 0906  DDIMER 0.40    Radiology/Studies:  Dg Chest 2 View  Result Date: 02/21/2018 CLINICAL DATA:  Chest pain and intermittent shortness of breath since 02/19/2018. EXAM: CHEST - 2 VIEW COMPARISON:  PA and lateral chest CT chest 11/04/2015. FINDINGS: The lungs are clear. Heart size is normal. No pneumothorax or pleural fluid. No acute bony abnormality. IMPRESSION: No acute disease. Electronically Signed   By: Drusilla Kannerhomas  Dalessio M.D.   On: 02/21/2018 09:48    Assessment and Plan:   1  Chest pain  With minimal elevation troponin  Pt did have mild calcifications on CT in 2016 Pt's current  pain / discomfort is atypical  in that it is  very long lasting.   EKG without acute changes   If coronary artery ischemia and prolonged discomfort would have expected a higher elevation in troponin.  On exam, BP and HR are out of ocntrol   Pt is anxious (while talking to him appears to exacerbate some of symtpoms)  Would follow serial troponins ASA Control BP   With compliance issues I would not recomm b blocker (concern about rebound HTN)   Would try cardiazem   Check lipids  Treat empirically for relfux (pt drinks in exces at times)  2 HTN   As above  3 R carotid bruit   Set up for carrotid USN  4  Substance abuse  Counselled on tobacco and EtOH use/cessation.  Pt says he is not using cocaine any more  If r/o for MI will make sure that pt has follow up in clinic Pt would benefit from social work consutl for support after    For questions or updates, please contact [facility] Please consult [email or web address] for contact info under Cardiology/STEMI.   Signed, Dietrich Pates, MD  02/21/2018 11:41 AM

## 2018-02-21 NOTE — ED Notes (Signed)
Hospitalist at bedside 

## 2018-02-21 NOTE — ED Notes (Signed)
Date and time results received: 02/21/18 0955   Test: Troponin Critical Value: 0.04  Name of Provider Notified: Dr. Ethelda ChickJacubowitz  Orders Received? Or Actions Taken?: None given

## 2018-02-21 NOTE — ED Provider Notes (Signed)
[facility] EMERGENCY DEPARTMENT Provider Note   CSN: [id number] Arrival date & time: 02/21/18  5621     History   Chief Complaint Chief Complaint  Patient presents with  . Chest Pain  Complains of chest pain onset 2 days ago sudden onset, pleuritic, left-sided nonradiating anterior.  Pain was constant until this morning when it resolved.  He gets similar pains approximately twice per week for the past 3 years.  He presents today as he wishes a note that he can return to work.  He is presently asymptomatic without treatment.  Pain is nonexertional, worse with deep inspiration.  No shortness of breath no nausea no sweatiness no other associated symptoms  HPI Steven Bradshaw is a 42 y.o. male.  HPI  Past Medical History:  Diagnosis Date  . Alcoholism /alcohol abuse (HCC)   . Hypertension   . Normal cardiac stress test 04/2015   low risk  . Suicidal ideation     Patient Active Problem List   Diagnosis Date Noted  . Allergic reaction 04/14/2015  . Essential hypertension 04/14/2015  . Chest pain with moderate risk of acute coronary syndrome 04/14/2015  . Tobacco abuse 04/14/2015  . ETOH abuse 04/14/2015    History reviewed. No pertinent surgical history.     Home Medications    Prior to Admission medications   Medication Sig Start Date End Date Taking? Authorizing Provider  chlordiazePOXIDE (LIBRIUM) 10 MG capsule Take 10 mg by mouth 2 (two) times daily.    [provider]  loratadine (CLARITIN) 10 MG tablet Take 10 mg by mouth daily as needed for allergies.    [provider]    Family History History reviewed. No pertinent family history.  Social History Social History   Tobacco Use  . Smoking status: Current Every Day Smoker    Types: Cigarettes  . Smokeless tobacco: Never Used  Substance Use Topics  . Alcohol use: Yes    Comment: has not drink in 2 weeks.   . Drug use: Yes    Types: Cocaine    Comment: "long time ago"      Allergies   Bee venom   Review of Systems Review of Systems  Constitutional: Negative.   HENT: Negative.   Respiratory: Negative.   Cardiovascular: Positive for chest pain.  Gastrointestinal: Negative.   Musculoskeletal: Negative.   Skin: Negative.   Neurological: Negative.   Psychiatric/Behavioral: Negative.   All other systems reviewed and are negative.    Physical Exam Updated Vital Signs BP (!) 185/99   Pulse (!) 118   Temp 98 F (36.7 C) (Oral)   Resp (!) 35   Ht 5\' 10"  (1.778 m)   Wt 81.6 kg (180 lb)   SpO2 97%   BMI 25.83 kg/m   Physical Exam  Constitutional: He appears well-developed and well-nourished.  HENT:  Head: Normocephalic and atraumatic.  Eyes: Pupils are equal, round, and reactive to light. Conjunctivae are normal.  Neck: Neck supple. No tracheal deviation present. No thyromegaly present.  Cardiovascular: Regular rhythm.  No murmur heard. Tachycardic  Pulmonary/Chest: Effort normal and breath sounds normal.  Abdominal: Soft. Bowel sounds are normal. He exhibits no distension. There is no tenderness.  Musculoskeletal: Normal range of motion. He exhibits no edema or tenderness.  Neurological: He is alert. Coordination normal.  Skin: Skin is warm and dry. No rash noted.  Psychiatric: He has a normal mood and affect.  Nursing note and vitals reviewed.    ED Treatments /  Results  Labs (all labs ordered are listed, but only abnormal results are displayed) Labs Reviewed - No data to display  EKG  EKG Interpretation  Date/Time:  Friday February 21 2018 08:44:22 EDT Ventricular Rate:  114 PR Interval:    QRS Duration: 91 QT Interval:  325 QTC Calculation: 448 R Axis:   52 Text Interpretation:  Sinus tachycardia Probable left atrial enlargement No significant change since last tracing Confirmed by Doug SouJacubowitz, Asmar Brozek [phone]([id number]) on 02/21/2018 8:53:38 AM       Radiology No results found.  Procedures Procedures (including critical care  time)  Medications Ordered in ED Medications - No data to display   Initial Impression / Assessment and Plan / ED Course  I have reviewed the triage vital signs and the nursing notes.  Pertinent labs & imaging results that were available during my care of the patient were reviewed by me and considered in my medical decision making (see chart for details).     10:50 AM patient remains asymptomatic.  In light of cardiac risk factors slightly elevated troponin.  I have consulted hospitalist Dr. Gwenlyn PerkingMadera to evaluate patient for overnight stay.  I also contacted Dr. Tenny Crawoss from cardiology service by telephone who suggested that [facility] does not need [facility].  She suggested hospitalist service may consult cardiology at their discretion  Final Clinical Impressions(s) / ED Diagnoses  Diagnosis atypical chest pain Final diagnoses:  None    ED Discharge Orders    None       Doug SouJacubowitz, Lysander Calixte, MD 02/21/18 1642

## 2018-02-22 ENCOUNTER — Inpatient Hospital Stay (HOSPITAL_COMMUNITY): Payer: Self-pay

## 2018-02-22 DIAGNOSIS — K219 Gastro-esophageal reflux disease without esophagitis: Principal | ICD-10-CM

## 2018-02-22 DIAGNOSIS — R079 Chest pain, unspecified: Secondary | ICD-10-CM

## 2018-02-22 LAB — BASIC METABOLIC PANEL
Anion gap: 12 (ref 5–15)
BUN: 10 mg/dL (ref 6–20)
CO2: 22 mmol/L (ref 22–32)
CREATININE: 0.58 mg/dL — AB (ref 0.61–1.24)
Calcium: 9.2 mg/dL (ref 8.9–10.3)
Chloride: 103 mmol/L (ref 101–111)
GFR calc Af Amer: >60 mL/min (ref >60)
GLUCOSE: 88 mg/dL (ref 65–99)
Potassium: 3.5 mmol/L (ref 3.5–5.1)
SODIUM: 137 mmol/L (ref 135–145)

## 2018-02-22 LAB — LIPID PANEL
CHOL/HDL RATIO: 2.8 ratio
Cholesterol: 79 mg/dL (ref 0–200)
HDL: 28 mg/dL — AB (ref >40)
LDL CALC: 40 mg/dL (ref 0–99)
TRIGLYCERIDES: 55 mg/dL (ref <150)
VLDL: 11 mg/dL (ref 0–40)

## 2018-02-22 LAB — TROPONIN I
TROPONIN I: 0.04 ng/mL — AB (ref <0.03)
Troponin I: 0.05 ng/mL (ref <0.03)

## 2018-02-22 LAB — ECHOCARDIOGRAM COMPLETE
Height: 70 in
Weight: 3190.4 oz

## 2018-02-22 LAB — HIV ANTIBODY (ROUTINE TESTING W REFLEX): HIV SCREEN 4TH GENERATION: NONREACTIVE

## 2018-02-22 NOTE — Progress Notes (Signed)
[facility] PROGRESS NOTE  Eulah PontGregory D Younts QIO:[id number] DOB: 30-Jun-1976 DOA: 02/21/2018 PCP: Patient, No Pcp Per   Interim summary and HPI: 42 y/o male with a history of tobacco and alcohol abuse,  came to the hospital on 3/22 complaining of chest pain that started on Wednesday (3/20), on the left side of his chest that did not improve until yesterday (3/22). Patient described the pain was 9/10 in intensity and has had these symptoms for 3 years.   Assessment/Plan: 1. Chest pain - flat elevation of troponin - long discussion regarding medication compliance and risk factor modification -EKG and telemetry without acute changes for ischemia - 2D echo reassuring  -continue baby ASA -will need outpatient follow up with PCP and if further pain despite medical management, with cardiology service  2. GERD - due to history of alcohol and tobacco abuse, and part of history reported, with concerns for gastritis, esophagitis  - continue PPI BID - on RPN GI cocktail for indigestion  3. Tobacco abuse - cessation counseling provided - on nicotine patch  4. Alcohol abuse - patient on CIWA protocol - he drinks on a daily basis.  - LFT's and PO4 normal.  - alcohol cessation provided - folic acid and thiamine ordered   5. HLD  - Lipid panel demonstrated LDL of 40, triglycerides of 55, and a low level of HDL of 28 - no need for statins -will recommend use of Fish oil and heart healthy diet   6. HTN - BP currently improved - not using any outpatient medications - on cardizem during hospital stay - heart healthy diet  7. Non compliance with medication regimen - long discussion regarding importance of medication compliance. - patient expressed needs of assistance with acquiring his medications at discharge -CM made aware   8. Right carotid bruit - on ASA - Carotid doppler- demonstrating no significant stenosis   9.  Hypomagnesemia -Most likely associated with alcohol  use -Repleted -We will recheck magnesium level in a.m.  Code Status: FULL Family Communication: None at bedside Disposition Plan: Continue assessing blood pressure control to further adjust medication as needed, in both case manager to assist with medication needs an outpatient establishment with PCP; if remains stable will discharge tomorrow.   Consultants:  Cardiology  Procedures:  2-D Echo    Left ventricle: The cavity size was normal. Wall thickness was   normal. Systolic function was vigorous. The estimated ejection   fraction was in the range of 65% to 70%. Wall motion was normal;   there were no regional wall motion abnormalities. Left   ventricular diastolic function parameters were normal. - Mitral valve: There was trivial regurgitation. - Right atrium: Central venous pressure (est): 3 mm Hg. - Atrial septum: No defect or patent foramen ovale was identified. - Tricuspid valve: There was trivial regurgitation. - Pericardium, extracardiac: There was no pericardial effusion.   Carotid US: -Mild intimal thickening without significant atherosclerosis. No hemodynamically significant ICA stenosis. Degree of narrowing estimated less than 50% bilaterally by ultrasound criteria. -Nonvisualized right vertebral artery, cannot exclude occlusion -Patent left vertebral artery with antegrade flow  Antibiotics:  None  HPI/Subjective: Patient denies chest pain, nausea or vomiting. Patient says he is feeling okay at this moment.   Objective: Vitals:   02/22/18 1200 02/22/18 1500  BP: 136/69 138/74  Pulse: (!) 110 (!) 110  Resp:  16  Temp:  97.7 F (36.5 C)  SpO2:  98%    Intake/Output Summary (Last 24 hours) at 02/22/2018  1626 Last data filed at 02/22/2018 0900 Gross per 24 hour  Intake 240 ml  Output -  Net 240 ml   Filed Weights   02/21/18 0840 02/21/18 1322  Weight: 81.6 kg (180 lb) 90.4 kg (199 lb 6.4 oz)    Exam:   General:  Alert and oriented x 3.  Patient appears a little anxious with light shaking of his arms. Afebrile, denied chest pain, nausea and vomiting.   Cardiovascular: mild tahycardia. S1 and S2 heard. No murmurs, gallops or rubs; no JVD.   Respiratory: Chest clear to auscultation. No use of accessory muscles. No wheezing, crackles or rhonchi.   Abdomen: Soft. Non tender to palpation. Non distended. Positive BS x 4 quadrants. No hepatosplenomegaly.   Musculoskeletal: Pulses full throughout. Strength 5/5 in all four extremities. No edema, no cyanosis or clubbing.  Data Reviewed: Basic Metabolic Panel: Recent Labs  Lab 02/21/18 0906 02/21/18 1224 02/22/18 0058  NA 139  --  137  K 3.6  --  3.5  CL 105  --  103  CO2 22  --  22  GLUCOSE 96  --  88  BUN 10  --  10  CREATININE 0.57*  --  0.58*  CALCIUM 9.4  --  9.2  MG  --  1.6*  --   PHOS  --  3.1  --    Liver Function Tests: Recent Labs  Lab 02/21/18 1224  AST 43*  ALT 59  ALKPHOS 114  BILITOT 1.5*  PROT 7.4  ALBUMIN 3.8   CBC: Recent Labs  Lab 02/21/18 0906  WBC 5.8  NEUTROABS 2.8  HGB 14.1  HCT 40.1  MCV 81.8  PLT 209   Cardiac Enzymes: Recent Labs  Lab 02/21/18 0906 02/21/18 1340 02/21/18 1806 02/22/18 0058 02/22/18 0713  TROPONINI 0.04* 0.04* 0.04* 0.05* 0.04*   Studies: Dg Chest 2 View  Result Date: 02/21/2018 CLINICAL DATA:  Chest pain and intermittent shortness of breath since 02/19/2018. EXAM: CHEST - 2 VIEW COMPARISON:  PA and lateral chest CT chest 11/04/2015. FINDINGS: The lungs are clear. Heart size is normal. No pneumothorax or pleural fluid. No acute bony abnormality. IMPRESSION: No acute disease. Electronically Signed   By: Drusilla Kanner M.D.   On: 02/21/2018 09:48   US Carotid Bilateral  Result Date: 02/21/2018 CLINICAL DATA:  Asymptomatic right carotid bruit, hypertension, tobacco use EXAM: BILATERAL CAROTID DUPLEX ULTRASOUND TECHNIQUE: Wallace Cullens scale imaging, color Doppler and duplex ultrasound were performed of bilateral  carotid and vertebral arteries in the neck. COMPARISON:  None. FINDINGS: Criteria: Quantification of carotid stenosis is based on velocity parameters that correlate the residual internal carotid diameter with NASCET-based stenosis levels, using the diameter of the distal internal carotid lumen as the denominator for stenosis measurement. The following velocity measurements were obtained: RIGHT ICA:  184/46 cm/sec CCA:  194/41 cm/sec SYSTOLIC ICA/CCA RATIO:  11.0 DIASTOLIC ICA/CCA RATIO:  1.1 ECA:  208 cm/sec LEFT ICA:  174/41 cm/sec CCA:  253/42 cm/sec SYSTOLIC ICA/CCA RATIO:  0.7 DIASTOLIC ICA/CCA RATIO:  1.0 ECA:  226 cm/sec RIGHT CAROTID ARTERY: Minor intimal thickening without significant atherosclerotic plaque formation. No significant luminal narrowing. Mild nonspecific diffuse velocity elevation throughout the carotid system without significant turbulent flow or spectral broadening. Degree of stenosis still estimated less than 50%. RIGHT VERTEBRAL ARTERY: Not visualized LEFT CAROTID ARTERY: Similar minor intimal thickening without significant plaque formation. No significant luminal narrowing by grayscale imaging. Mild nonspecific diffuse velocity elevation throughout the carotid system. LEFT VERTEBRAL ARTERY:  Antegrade IMPRESSION: Mild intimal thickening without significant atherosclerosis. No hemodynamically significant ICA stenosis. Degree of narrowing estimated less than 50% bilaterally by ultrasound criteria. Nonvisualized right vertebral artery, cannot exclude occlusion Patent left vertebral artery with antegrade flow Electronically Signed   By: Judie Petit.  Shick M.D.   On: 02/21/2018 14:46    Scheduled Meds: . aspirin EC  81 mg Oral Daily  . diltiazem  60 mg Oral Q6H  . folic acid  1 mg Oral Daily  . heparin injection (subcutaneous)  5,000 Units Subcutaneous Q8H  . LORazepam  0-4 mg Intravenous Q6H   Followed by  . [START ON 02/23/2018] LORazepam  0-4 mg Intravenous Q12H  . multivitamin with  minerals  1 tablet Oral Daily  . nicotine  21 mg Transdermal Daily  . pantoprazole  40 mg Oral BID  . thiamine  100 mg Oral Daily   Or  . thiamine  100 mg Intravenous Daily    Time spent: 25 minutes   Vassie Loll  [facility] Pager [phone]. If 7PM-7AM, please contact night-coverage at [email or web address], password [facility] 02/22/2018, 4:26 PM  LOS: 1 day

## 2018-02-22 NOTE — Progress Notes (Signed)
*  PRELIMINARY RESULTS* Echocardiogram 2D Echocardiogram has been performed.  Stacey DrainWhite, Edynn Gillock J 02/22/2018, 11:22 AM

## 2018-02-23 DIAGNOSIS — R0789 Other chest pain: Secondary | ICD-10-CM

## 2018-02-23 DIAGNOSIS — R0989 Other specified symptoms and signs involving the circulatory and respiratory systems: Secondary | ICD-10-CM

## 2018-02-23 MED ORDER — THIAMINE HCL 100 MG PO TABS: 100.0000 mg | ORAL_TABLET | Freq: Every day | ORAL | 1 refills | Status: DC

## 2018-02-23 MED ORDER — NICOTINE 21 MG/24HR TD PT24: 21.0000 mg | MEDICATED_PATCH | Freq: Every day | TRANSDERMAL | 0 refills | Status: DC

## 2018-02-23 MED ORDER — ACETAMINOPHEN 325 MG PO TABS: 650.0000 mg | ORAL_TABLET | ORAL | 0 refills | Status: DC | PRN

## 2018-02-23 MED ORDER — PANTOPRAZOLE SODIUM 40 MG PO TBEC: 40.0000 mg | DELAYED_RELEASE_TABLET | Freq: Two times a day (BID) | ORAL | 1 refills | Status: DC

## 2018-02-23 MED ORDER — ASPIRIN 81 MG PO TBEC: 81.0000 mg | DELAYED_RELEASE_TABLET | Freq: Every day | ORAL | 1 refills | Status: DC

## 2018-02-23 MED ORDER — FOLIC ACID 1 MG PO TABS: 1.0000 mg | ORAL_TABLET | Freq: Every day | ORAL | 1 refills | Status: DC

## 2018-02-23 MED ORDER — DILTIAZEM HCL ER 90 MG PO CP12: 90.0000 mg | ORAL_CAPSULE | Freq: Two times a day (BID) | ORAL | 1 refills | Status: DC

## 2018-02-23 NOTE — Discharge Summary (Signed)
Physician Discharge Summary  Eulah PontGregory D Asaro [id number] DOB: [birth year] DOA: 02/21/2018  PCP: Steven Bradshaw, No Pcp Per  Admit date: 02/21/2018 Discharge date: 02/23/2018  Time spent: 35 minutes  Recommendations for Outpatient Follow-up:  1. Repeat basic metabolic panel and magnesium level to follow electrolytes trend and renal function 2. Reassess blood pressure and further adjust antihypertensive regimen as needed   Discharge Diagnoses:  Principal Problem:   Chest pain with moderate risk of acute coronary syndrome Active Problems:   Essential hypertension   Tobacco abuse   ETOH abuse   Atypical chest pain   Non compliance w medication regimen   Bruit; right carotid artery stenosis   Discharge Condition: Stable and improved.  Steven Bradshaw discharged home with instructions to set up appointment with PCP as instructed and also to follow-up as arranged with cardiology service.  Diet recommendation: Heart healthy diet  Filed Weights   02/21/18 0840 02/21/18 1322  Weight: 81.6 kg (180 lb) 90.4 kg (199 lb 6.4 oz)    Brief History of present illness:  42 y/o male with PMH significant for HTN, HLD, tobacco abuse and alcohol abuse; who presented to ED with complaints of CP. Steven Bradshaw's pain started on Wednesday (3/20), localized on the left side of his chest, with associated nausea and vomiting, no SOB, no palpitations. Pain lasted until 3/22 morning (spontaneously resolved). Steven Bradshaw reported pain was 9-10 in intensity and knife stabbing sensation. Nothing made pain better; movement and deep breath.  Steven Bradshaw denies palpitation, hematemesis, focal deficit, dysuria, hematuria, abd pain, melena or hematochezia. Also no fever, no chills and no cough.   Hospital Course:  1. Chest pain: presumed Non-cardiac -flat elevation of troponin -long discussion regarding medication compliance and risk factor modification -EKG and telemetry without acute changes for ischemia -2D echo reassuring  -discharge  with instructions to use daily baby ASA -will need outpatient follow up with PCP and if further pain despite medical management persisted, will need follow up with cardiology service  2. GERD -due to history of alcohol and tobacco abuse, high risk for gastritis/esophagitis  -continue PPI BID -advise to quit alcohol and tobacco use  3. Tobacco abuse -cessation counseling provided -discharge on nicotine patch  4. Alcohol abuse -Steven Bradshaw on CIWA protocol while inpatient  -reported he drinks on a daily basis.  -LFT's and PO4 normal.  -alcohol cessation provided -folic acid and thiamine prescribed at discharge  5. HLD  -Lipid panel demonstrated LDL of 40, triglycerides of 55, and a low level of HDL of 28 -no statins ordered at this time  -will recommend use of Fish oil and to follow heart healthy diet   6. HTN -BP much improved -he was not using any outpatient medications -will discharge on cardizem BID -advise to follow heart healthy diet  7. Non compliance with medication regimen -long discussion regarding importance of medication compliance. -Steven Bradshaw expressed needs of assistance with acquiring his medications at discharge -CM made aware and will help with initial prescription   8. Right carotid bruit -Carotid doppler- demonstrating no significant stenosis  -will discharge on ASA -advise to quit smoking   9.  Hypomagnesemia -Most likely associated with alcohol use -Repleted  Procedures:  2-D Echo    Left ventricle: The cavity size was normal. Wall thickness was normal. Systolic function was vigorous. The estimated ejection fraction was in the range of 65% to 70%. Wall motion was normal; there were no regional wall motion abnormalities. Left ventricular diastolic function parameters were normal. - Mitral valve: There was trivial  regurgitation. - Right atrium: Central venous pressure (est): 3 mm Hg. - Atrial septum: No defect or patent foramen ovale  was identified. - Tricuspid valve: There was trivial regurgitation. - Pericardium, extracardiac: There was no pericardial effusion.   Carotid US: -Mild intimal thickening without significant atherosclerosis. No hemodynamically significant ICA stenosis. Degree of narrowing estimated less than 50% bilaterally by ultrasound criteria. -Nonvisualized right vertebral artery, cannot exclude occlusion -Patent left vertebral artery with antegrade flow   Consultations:  Cardiology service  Discharge Exam: Vitals:   02/23/18 0649 02/23/18 1200  BP: 132/77 (!) 141/72  Pulse: 98 (!) 105  Resp: 16   Temp: 98.9 F (37.2 C)   SpO2: 100%     General:  Alert and oriented x 3. Steven Bradshaw denies any acute complaints, no shortness of breath, no chest pain.  No nausea or vomiting.  Cardiovascular: mild tahycardia. S1 and S2 heard. No murmurs, gallops or rubs; no JVD.   Respiratory: Chest clear to auscultation. No use of accessory muscles. No wheezing, crackles or rhonchi.   Abdomen: Soft. Non tender to palpation. Non distended. Positive BS x 4 quadrants. No hepatosplenomegaly.   Musculoskeletal: Pulses full throughout. Strength 5/5 in all four extremities. No edema, no cyanosis or clubbing.  Discharge Instructions   Discharge Instructions    Diet - low sodium heart healthy   Complete by:  As directed    Discharge instructions   Complete by:  As directed    Follow heart healthy diet Take medications as prescribed  Please establish care with a PCP and follow up with him in 2 weeks Follow heart healthy diet Stop smoking and Stop alcohol use     Allergies as of 02/23/2018      Reactions   Bee Venom Anaphylaxis      Medication List    TAKE these medications   acetaminophen 325 MG tablet Commonly known as:  TYLENOL Take 2 tablets (650 mg total) by mouth every 4 (four) hours as needed for headache or mild pain.   aspirin 81 MG EC tablet Take 1 tablet (81 mg total) by mouth  daily. Start taking on:  02/24/2018   diltiazem 90 MG 12 hr capsule Commonly known as:  CARDIZEM SR Take 1 capsule (90 mg total) by mouth 2 (two) times daily.   folic acid 1 MG tablet Commonly known as:  FOLVITE Take 1 tablet (1 mg total) by mouth daily. Start taking on:  02/24/2018   nicotine 21 mg/24hr patch Commonly known as:  NICODERM CQ - dosed in mg/24 hours Place 1 patch (21 mg total) onto the skin daily. Start taking on:  02/24/2018   pantoprazole 40 MG tablet Commonly known as:  PROTONIX Take 1 tablet (40 mg total) by mouth 2 (two) times daily.   thiamine 100 MG tablet Take 1 tablet (100 mg total) by mouth daily. Start taking on:  02/24/2018      Allergies  Allergen Reactions  . Bee Venom Anaphylaxis   Follow-up Information    Dyann Kief, PA-C Follow up on 03/03/2018.   Specialty:  Cardiology Why:  Appointment scheduled for 1:30 PM. Contact information: [address] [place] [address] [phone]        [facility] Follow up.   Why:  Please call to make an appointment for primary care.  Contact information: [address] [place] [address] [phone]       [facility] Follow up.  Why:  Please call to make an appointment for primary care follow-up.  Contact information: [address] [place] [address] [phone]           The results of significant diagnostics from this hospitalization (including imaging, microbiology, ancillary and laboratory) are listed below for reference.    Significant Diagnostic Studies: Dg Chest 2 View  Result Date: 02/21/2018 CLINICAL DATA:  Chest pain and intermittent shortness of breath since 02/19/2018. EXAM: CHEST - 2 VIEW COMPARISON:  PA and lateral chest CT chest 11/04/2015. FINDINGS: The lungs are clear. Heart size is normal. No pneumothorax or pleural fluid. No acute bony abnormality. IMPRESSION: No acute disease. Electronically Signed    By: Drusilla Kanner M.D.   On: 02/21/2018 09:48   US Carotid Bilateral  Result Date: 02/21/2018 CLINICAL DATA:  Asymptomatic right carotid bruit, hypertension, tobacco use EXAM: BILATERAL CAROTID DUPLEX ULTRASOUND TECHNIQUE: Wallace Cullens scale imaging, color Doppler and duplex ultrasound were performed of bilateral carotid and vertebral arteries in the neck. COMPARISON:  None. FINDINGS: Criteria: Quantification of carotid stenosis is based on velocity parameters that correlate the residual internal carotid diameter with NASCET-based stenosis levels, using the diameter of the distal internal carotid lumen as the denominator for stenosis measurement. The following velocity measurements were obtained: RIGHT ICA:  184/46 cm/sec CCA:  194/41 cm/sec SYSTOLIC ICA/CCA RATIO:  11.0 DIASTOLIC ICA/CCA RATIO:  1.1 ECA:  208 cm/sec LEFT ICA:  174/41 cm/sec CCA:  253/42 cm/sec SYSTOLIC ICA/CCA RATIO:  0.7 DIASTOLIC ICA/CCA RATIO:  1.0 ECA:  226 cm/sec RIGHT CAROTID ARTERY: Minor intimal thickening without significant atherosclerotic plaque formation. No significant luminal narrowing. Mild nonspecific diffuse velocity elevation throughout the carotid system without significant turbulent flow or spectral broadening. Degree of stenosis still estimated less than 50%. RIGHT VERTEBRAL ARTERY: Not visualized LEFT CAROTID ARTERY: Similar minor intimal thickening without significant plaque formation. No significant luminal narrowing by grayscale imaging. Mild nonspecific diffuse velocity elevation throughout the carotid system. LEFT VERTEBRAL ARTERY:  Antegrade IMPRESSION: Mild intimal thickening without significant atherosclerosis. No hemodynamically significant ICA stenosis. Degree of narrowing estimated less than 50% bilaterally by ultrasound criteria. Nonvisualized right vertebral artery, cannot exclude occlusion Patent left vertebral artery with antegrade flow Electronically Signed   By: Judie Petit.  Shick M.D.   On: 02/21/2018 14:46    Labs: Basic Metabolic Panel: Recent Labs  Lab 02/21/18 0906 02/21/18 1224 02/22/18 0058  NA 139  --  137  K 3.6  --  3.5  CL 105  --  103  CO2 22  --  22  GLUCOSE 96  --  88  BUN 10  --  10  CREATININE 0.57*  --  0.58*  CALCIUM 9.4  --  9.2  MG  --  1.6*  --   PHOS  --  3.1  --    Liver Function Tests: Recent Labs  Lab 02/21/18 1224  AST 43*  ALT 59  ALKPHOS 114  BILITOT 1.5*  PROT 7.4  ALBUMIN 3.8   CBC: Recent Labs  Lab 02/21/18 0906  WBC 5.8  NEUTROABS 2.8  HGB 14.1  HCT 40.1  MCV 81.8  PLT 209   Cardiac Enzymes: Recent Labs  Lab 02/21/18 0906 02/21/18 1340 02/21/18 1806 02/22/18 0058 02/22/18 0713  TROPONINI 0.04* 0.04* 0.04* 0.05* 0.04*    Signed:  Vassie Loll MD.  [facility] 02/23/2018, 1:04 PM

## 2018-03-03 ENCOUNTER — Ambulatory Visit: Payer: Self-pay | Admitting: Physician Assistant

## 2018-03-27 NOTE — Progress Notes (Deleted)
Cardiology Office Note    Date:  03/27/2018   ID:  Steven Bradshaw, DOB 1976/02/23, MRN [id number]  PCP:  Patient, No Pcp Per  Cardiologist: No primary care provider on file.  No chief complaint on file.   History of Present Illness:  Steven Bradshaw is a 42 y.o. male with history of hypertension, tobacco abuse, noncompliance, EtOH, work-up in 2016 for chest pain with CT showing some coronary calcification, normal echo and stress test.  Admitted with chest pain to [facility] 02/21/2018 and seen in consult by Dr. Dietrich Pates.  Troponins minimally elevated at 0.05 and 0.04 and pain was atypical.  EKG without acute change.  Because of compliance issues and concern about rebound hypertension she did not recommend beta-blocker and wanted to try Cardizem.  LDL 40 cholesterol 79.  2D echo 02/22/2018 LVEF 65 to 70% no wall motion abnormalities normal diastolic function.  Carotid Dopplers less than 50% bilateral ICA stenosis.   Past Medical History:  Diagnosis Date  . Alcoholism /alcohol abuse (HCC)   . Hypertension   . Normal cardiac stress test 04/2015   low risk  . Suicidal ideation     No past surgical history on file.  Current Medications: No outpatient medications have been marked as taking for the 03/31/18 encounter (Appointment) with Dyann Kief, PA-C.     Allergies:   Bee venom   Social History   Socioeconomic History  . Marital status: Single    Spouse name: Not on file  . Number of children: Not on file  . Years of education: Not on file  . Highest education level: Not on file  Occupational History  . Not on file  Social Needs  . Financial resource strain: Not on file  . Food insecurity:    Worry: Not on file    Inability: Not on file  . Transportation needs:    Medical: Not on file    Non-medical: Not on file  Tobacco Use  . Smoking status: Current Every Day Smoker    Types: Cigarettes  . Smokeless tobacco: Never Used  Substance and Sexual  Activity  . Alcohol use: Yes    Comment: has not drink in 2 weeks.   . Drug use: Yes    Types: Cocaine    Comment: "long time ago"  . Sexual activity: Not on file  Lifestyle  . Physical activity:    Days per week: Not on file    Minutes per session: Not on file  . Stress: Not on file  Relationships  . Social connections:    Talks on phone: Not on file    Gets together: Not on file    Attends religious service: Not on file    Active member of club or organization: Not on file    Attends meetings of clubs or organizations: Not on file    Relationship status: Not on file  Other Topics Concern  . Not on file  Social History Narrative  . Not on file     Family History:  The patient's ***family history is not on file.   ROS:   Please see the history of present illness.    ROS All other systems reviewed and are negative.   PHYSICAL EXAM:   VS:  There were no vitals taken for this visit.  Physical Exam  GEN: Well nourished, well developed, in no acute distress  HEENT: normal  Neck: no JVD, carotid bruits, or masses Cardiac:RRR; no  murmurs, rubs, or gallops  Respiratory:  clear to auscultation bilaterally, normal work of breathing GI: soft, nontender, nondistended, + BS Ext: without cyanosis, clubbing, or edema, Good distal pulses bilaterally MS: no deformity or atrophy  Skin: warm and dry, no rash Neuro:  Alert and Oriented x 3, Strength and sensation are intact Psych: euthymic mood, full affect  Wt Readings from Last 3 Encounters:  02/21/18 199 lb 6.4 oz (90.4 kg)  11/04/15 204 lb (92.5 kg)  09/28/15 210 lb (95.3 kg)      Studies/Labs Reviewed:   EKG:  EKG is*** ordered today.  The ekg ordered today demonstrates ***  Recent Labs: 02/21/2018: ALT 59; Hemoglobin 14.1; Magnesium 1.6; Platelets 209 02/22/2018: BUN 10; Creatinine, Ser 0.58; Potassium 3.5; Sodium 137   Lipid Panel    Component Value Date/Time   CHOL 79 02/22/2018 0058   TRIG 55 02/22/2018 0058    HDL 28 (L) 02/22/2018 0058   CHOLHDL 2.8 02/22/2018 0058   VLDL 11 02/22/2018 0058   LDLCALC 40 02/22/2018 0058    Additional studies/ records that were reviewed today include:  2D echo 3/23/2019Study Conclusions   - Left ventricle: The cavity size was normal. Wall thickness was   normal. Systolic function was vigorous. The estimated ejection   fraction was in the range of 65% to 70%. Wall motion was normal;   there were no regional wall motion abnormalities. Left   ventricular diastolic function parameters were normal. - Mitral valve: There was trivial regurgitation. - Right atrium: Central venous pressure (est): 3 mm Hg. - Atrial septum: No defect or patent foramen ovale was identified. - Tricuspid valve: There was trivial regurgitation. - Pericardium, extracardiac: There was no pericardial effusion  Carotid Dopplers 3/22/2019IMPRESSION: Mild intimal thickening without significant atherosclerosis. No hemodynamically significant ICA stenosis. Degree of narrowing estimated less than 50% bilaterally by ultrasound criteria.   Nonvisualized right vertebral artery, cannot exclude occlusion   Patent left vertebral artery with antegrade flow     Electronically Signed   By: Judie PetitM.  Shick M.D.   On: 02/21/2018 14:46     CT scan 2016IMPRESSION: 1. No pulmonary emboli or acute abnormality. 2. Mild increase in prominence of a probable normal thymus gland. Although a portion of this has a mass-like appearance, the majority does not, making a mass such as a thymoma unlikely. 3. Stable mildly prominent right hilar lymph node. 4. Stable mild thyroid goiter without visible nodules.   ASSESSMENT:    1. Atypical chest pain   2. Essential hypertension   3. Tobacco abuse   4. Non compliance w medication regimen   5. ETOH abuse   6. Bruit      PLAN:  In order of problems listed above:  Atypical chest pain with previous calcium on CT in 2016 history of noncompliance  Essential  hypertension  Tobacco abuse  Noncompliance with medical regimen  EtOH abuse  Right carotid bruit with less than 50% bilateral ICA stenosis    Medication Adjustments/Labs and Tests Ordered: Current medicines are reviewed at length with the patient today.  Concerns regarding medicines are outlined above.  Medication changes, Labs and Tests ordered today are listed in the Patient Instructions below. There are no Patient Instructions on file for this visit.   Elson ClanSigned, Michele Lenze, PA-C  03/27/2018 2:00 PM    [facility] [address], [place], [place]  [address] Phone: [phone]; Fax: [phone]

## 2018-03-31 ENCOUNTER — Ambulatory Visit: Payer: Self-pay | Admitting: Physician Assistant

## 2019-12-23 ENCOUNTER — Ambulatory Visit: Payer: Medicaid Other | Attending: Internal Medicine

## 2019-12-23 DIAGNOSIS — Z20822 Contact with and (suspected) exposure to covid-19: Secondary | ICD-10-CM

## 2019-12-24 LAB — NOVEL CORONAVIRUS, NAA: SARS-CoV-2, NAA: NOT DETECTED

## 2020-03-17 ENCOUNTER — Ambulatory Visit: Payer: Self-pay | Attending: Internal Medicine

## 2020-03-17 DIAGNOSIS — Z23 Encounter for immunization: Secondary | ICD-10-CM

## 2020-03-17 NOTE — Progress Notes (Signed)
   Covid-19 Vaccination Clinic  Name:  ASENCION LOVEDAY    MRN: [id number] DOB: 1976/04/16  03/17/2020  Mr. Yang was observed post Covid-19 immunization for 30 minutes based on pre-vaccination screening without incident. He was provided with Vaccine Information Sheet and instruction to access the V-Safe system.   Mr. Whang was instructed to call 911 with any severe reactions post vaccine: Marland Kitchen Difficulty breathing  . Swelling of face and throat  . A fast heartbeat  . A bad rash all over body  . Dizziness and weakness   Immunizations Administered    Name Date Dose VIS Date Route   Pfizer COVID-19 Vaccine 03/17/2020 10:30 AM 0.3 mL 11/13/2019 Intramuscular   Manufacturer: [employer], [employer]   Lot: [id number]   NDC: [address]-4

## 2020-04-09 ENCOUNTER — Ambulatory Visit: Payer: Medicaid Other | Attending: Internal Medicine

## 2020-04-09 DIAGNOSIS — Z23 Encounter for immunization: Secondary | ICD-10-CM

## 2020-04-09 NOTE — Progress Notes (Signed)
   Covid-19 Vaccination Clinic  Name:  CAROLYN MANISCALCO    MRN: [id number] DOB: 06-15-1976  04/09/2020  Mr. Millea was observed post Covid-19 immunization for 15 minutes without incident. He was provided with Vaccine Information Sheet and instruction to access the V-Safe system.   Mr. Maalouf was instructed to call 911 with any severe reactions post vaccine: Marland Kitchen Difficulty breathing  . Swelling of face and throat  . A fast heartbeat  . A bad rash all over body  . Dizziness and weakness   Immunizations Administered    Name Date Dose VIS Date Route   Pfizer COVID-19 Vaccine 04/09/2020 10:42 AM 0.3 mL 01/27/2019 Intramuscular   Manufacturer: [employer], [employer]   Lot: [id number]   NDC: [address]-0

## 2020-04-11 ENCOUNTER — Ambulatory Visit: Payer: Self-pay

## 2020-04-26 ENCOUNTER — Ambulatory Visit: Payer: Self-pay

## 2020-04-26 ENCOUNTER — Other Ambulatory Visit: Payer: Self-pay | Admitting: Family Medicine

## 2020-04-26 ENCOUNTER — Other Ambulatory Visit: Payer: Self-pay

## 2020-04-26 DIAGNOSIS — M79644 Pain in right finger(s): Secondary | ICD-10-CM

## 2020-05-24 ENCOUNTER — Ambulatory Visit: Payer: Self-pay | Admitting: Internal Medicine

## 2020-07-31 ENCOUNTER — Other Ambulatory Visit: Payer: Self-pay

## 2020-07-31 ENCOUNTER — Encounter (HOSPITAL_COMMUNITY): Payer: Self-pay | Admitting: Emergency Medicine

## 2020-07-31 ENCOUNTER — Emergency Department (HOSPITAL_COMMUNITY): Payer: Self-pay

## 2020-07-31 ENCOUNTER — Emergency Department (HOSPITAL_COMMUNITY)
Admission: EM | Admit: 2020-07-31 | Discharge: 2020-07-31 | Disposition: A | Discharge status: Home / Self Care | Payer: Self-pay | Attending: Emergency Medicine | Admitting: Emergency Medicine

## 2020-07-31 DIAGNOSIS — I10 Essential (primary) hypertension: Secondary | ICD-10-CM | POA: Insufficient documentation

## 2020-07-31 DIAGNOSIS — Z20822 Contact with and (suspected) exposure to covid-19: Secondary | ICD-10-CM | POA: Insufficient documentation

## 2020-07-31 DIAGNOSIS — R197 Diarrhea, unspecified: Secondary | ICD-10-CM | POA: Insufficient documentation

## 2020-07-31 DIAGNOSIS — Z79899 Other long term (current) drug therapy: Secondary | ICD-10-CM | POA: Insufficient documentation

## 2020-07-31 DIAGNOSIS — R509 Fever, unspecified: Secondary | ICD-10-CM | POA: Insufficient documentation

## 2020-07-31 DIAGNOSIS — F17210 Nicotine dependence, cigarettes, uncomplicated: Secondary | ICD-10-CM | POA: Insufficient documentation

## 2020-07-31 DIAGNOSIS — Z7982 Long term (current) use of aspirin: Secondary | ICD-10-CM | POA: Insufficient documentation

## 2020-07-31 DIAGNOSIS — R112 Nausea with vomiting, unspecified: Secondary | ICD-10-CM | POA: Insufficient documentation

## 2020-07-31 LAB — COMPREHENSIVE METABOLIC PANEL
ALT: 38 U/L (ref 0–44)
AST: 26 U/L (ref 15–41)
Albumin: 4.1 g/dL (ref 3.5–5.0)
Alkaline Phosphatase: 80 U/L (ref 38–126)
Anion gap: 10 (ref 5–15)
BUN: 13 mg/dL (ref 6–20)
CO2: 24 mmol/L (ref 22–32)
Calcium: 9.7 mg/dL (ref 8.9–10.3)
Chloride: 104 mmol/L (ref 98–111)
Creatinine, Ser: 0.86 mg/dL (ref 0.61–1.24)
GFR calc Af Amer: >60 mL/min (ref >60)
GFR calc non Af Amer: >60 mL/min (ref >60)
Glucose, Bld: 103 mg/dL — ABNORMAL HIGH (ref 70–99)
Potassium: 4.5 mmol/L (ref 3.5–5.1)
Sodium: 138 mmol/L (ref 135–145)
Total Bilirubin: 0.8 mg/dL (ref 0.3–1.2)
Total Protein: 7.3 g/dL (ref 6.5–8.1)

## 2020-07-31 LAB — SARS CORONAVIRUS 2 BY RT PCR (HOSPITAL ORDER, PERFORMED IN ~~LOC~~ HOSPITAL LAB): SARS Coronavirus 2: NEGATIVE

## 2020-07-31 LAB — CBC
HCT: 44.3 % (ref 39.0–52.0)
Hemoglobin: 16 g/dL (ref 13.0–17.0)
MCH: 32.1 pg (ref 26.0–34.0)
MCHC: 36.1 g/dL — ABNORMAL HIGH (ref 30.0–36.0)
MCV: 89 fL (ref 80.0–100.0)
Platelets: 288 10*3/uL (ref 150–400)
RBC: 4.98 MIL/uL (ref 4.22–5.81)
RDW: 10.7 % — ABNORMAL LOW (ref 11.5–15.5)
WBC: 6.4 10*3/uL (ref 4.0–10.5)
nRBC: 0 % (ref 0.0–0.2)

## 2020-07-31 LAB — LIPASE, BLOOD: Lipase: 23 U/L (ref 11–51)

## 2020-07-31 LAB — URINALYSIS, ROUTINE W REFLEX MICROSCOPIC
Bilirubin Urine: NEGATIVE
Glucose, UA: NEGATIVE mg/dL
Hgb urine dipstick: NEGATIVE
Ketones, ur: NEGATIVE mg/dL
Leukocytes,Ua: NEGATIVE
Nitrite: NEGATIVE
Protein, ur: NEGATIVE mg/dL
Specific Gravity, Urine: >1.046 — ABNORMAL HIGH (ref 1.005–1.030)
pH: 5 (ref 5.0–8.0)

## 2020-07-31 MED ORDER — ONDANSETRON HCL 4 MG/2ML IJ SOLN
4.0000 mg | Freq: Once | INTRAMUSCULAR | Status: AC
  Administered 2020-07-31: 4 mg via INTRAVENOUS
  Filled 2020-07-31: qty 2

## 2020-07-31 MED ORDER — SODIUM CHLORIDE 0.9 % IV BOLUS
1000.0000 mL | Freq: Once | INTRAVENOUS | Status: AC
  Administered 2020-07-31: 1000 mL via INTRAVENOUS

## 2020-07-31 MED ORDER — ONDANSETRON HCL 4 MG PO TABS: 4.0000 mg | ORAL_TABLET | Freq: Four times a day (QID) | ORAL | 0 refills | Status: DC

## 2020-07-31 MED ORDER — IOHEXOL 300 MG/ML  SOLN
100.0000 mL | Freq: Once | INTRAMUSCULAR | Status: AC | PRN
  Administered 2020-07-31: 16:00:00 100 mL via INTRAVENOUS

## 2020-07-31 MED ORDER — SODIUM CHLORIDE (PF) 0.9 % IJ SOLN
INTRAMUSCULAR | Status: AC
  Filled 2020-07-31: qty 50

## 2020-07-31 NOTE — ED Provider Notes (Signed)
[facility]-EMERGENCY DEPT Provider Note   CSN: [id number] Arrival date & time: 07/31/20  1010     History Chief Complaint  Patient presents with  . Abdominal Pain  . Diarrhea  . Emesis    Steven Bradshaw is a 44 y.o. male.  44 year old male with prior medical history detailed below presents for evaluation of nausea, vomiting, and diarrhea.  Patient reports 1 week of symptoms.  He reports subjective fevers at home.  He reports persistent nausea associated with occasional vomiting.  Patient describes intermittent loose watery diarrhea.  He denies bloody emesis or blood in his bowel movements.  Patient denies specific acute abdominal discomfort.  He denies chest pain.  He reports associated malaise and fatigue.  He reports that his employer sent him home from work on Friday given his symptoms and a reported elevated temperature at a fever check.  He is s/p Covid vaccination.   The history is provided by the patient and medical records.  Illness Location:  Nausea, vomiting, diarrhea Severity:  Moderate Onset quality:  Gradual Duration:  1 week Timing:  Constant Progression:  Worsening Chronicity:  New Associated symptoms: fever and vomiting   Associated symptoms: no chest pain        Past Medical History:  Diagnosis Date  . Alcoholism /alcohol abuse (HCC)   . Hypertension   . Normal cardiac stress test 04/2015   low risk  . Suicidal ideation   . Thyroid disease     Patient Active Problem List   Diagnosis Date Noted  . Bruit   . Atypical chest pain 02/21/2018  . Non compliance w medication regimen 02/21/2018  . Allergic reaction 04/14/2015  . Essential hypertension 04/14/2015  . Chest pain with moderate risk of acute coronary syndrome 04/14/2015  . Tobacco abuse 04/14/2015  . ETOH abuse 04/14/2015    History reviewed. No pertinent surgical history.     No family history on file.  Social History   Tobacco Use  . Smoking status:  Current Every Day Smoker    Types: Cigarettes  . Smokeless tobacco: Never Used  Substance Use Topics  . Alcohol use: Yes    Comment: has not drink in 2 weeks.   . Drug use: Yes    Types: Cocaine    Comment: "long time ago"    Home Medications Prior to Admission medications   Medication Sig Start Date End Date Taking? Authorizing Provider  acetaminophen (TYLENOL) 325 MG tablet Take 2 tablets (650 mg total) by mouth every 4 (four) hours as needed for headache or mild pain. 02/23/18   Vassie Loll, MD  aspirin EC 81 MG EC tablet Take 1 tablet (81 mg total) by mouth daily. 02/24/18   Vassie Loll, MD  diltiazem (CARDIZEM SR) 90 MG 12 hr capsule Take 1 capsule (90 mg total) by mouth 2 (two) times daily. 02/23/18   Vassie Loll, MD  folic acid (FOLVITE) 1 MG tablet Take 1 tablet (1 mg total) by mouth daily. 02/24/18   Vassie Loll, MD  nicotine (NICODERM CQ - DOSED IN MG/24 HOURS) 21 mg/24hr patch Place 1 patch (21 mg total) onto the skin daily. 02/24/18   Vassie Loll, MD  pantoprazole (PROTONIX) 40 MG tablet Take 1 tablet (40 mg total) by mouth 2 (two) times daily. 02/23/18   Vassie Loll, MD  thiamine 100 MG tablet Take 1 tablet (100 mg total) by mouth daily. 02/24/18   Vassie Loll, MD    Allergies    Alphonsus Sias  venom  Review of Systems   Review of Systems  Constitutional: Positive for fever.  Cardiovascular: Negative for chest pain.  Gastrointestinal: Positive for vomiting.  All other systems reviewed and are negative.   Physical Exam Updated Vital Signs BP (!) 173/104 (BP Location: Left Arm)   Pulse 86   Temp 98.1 F (36.7 C) (Oral)   Resp (!) 27   SpO2 100%   Physical Exam Vitals and nursing note reviewed.  Constitutional:      General: He is not in acute distress.    Appearance: He is well-developed.  HENT:     Head: Normocephalic and atraumatic.  Eyes:     Conjunctiva/sclera: Conjunctivae normal.     Pupils: Pupils are equal, round, and reactive to light.    Cardiovascular:     Rate and Rhythm: Normal rate and regular rhythm.     Heart sounds: Normal heart sounds.  Pulmonary:     Effort: Pulmonary effort is normal. No respiratory distress.     Breath sounds: Normal breath sounds.  Abdominal:     General: Abdomen is flat. There is no distension.     Palpations: Abdomen is soft.     Tenderness: There is no abdominal tenderness.  Musculoskeletal:        General: No deformity. Normal range of motion.     Cervical back: Normal range of motion and neck supple.  Skin:    General: Skin is warm and dry.  Neurological:     Mental Status: He is alert and oriented to person, place, and time.     ED Results / Procedures / Treatments   Labs (all labs ordered are listed, but only abnormal results are displayed) Labs Reviewed  COMPREHENSIVE METABOLIC PANEL - Abnormal; Notable for the following components:      Result Value   Glucose, Bld 103 (*)    All other components within normal limits  CBC - Abnormal; Notable for the following components:   MCHC 36.1 (*)    RDW 10.7 (*)    All other components within normal limits  URINALYSIS, ROUTINE W REFLEX MICROSCOPIC - Abnormal; Notable for the following components:   Specific Gravity, Urine >1.046 (*)    All other components within normal limits  SARS CORONAVIRUS 2 BY RT PCR [facility] ORDER, PERFORMED IN [facility] LAB)  LIPASE, BLOOD    EKG EKG Interpretation  Date/Time:  Sunday July 31 2020 10:19:28 EDT Ventricular Rate:  101 PR Interval:    QRS Duration: 83 QT Interval:  312 QTC Calculation: 405 R Axis:   69 Text Interpretation: Sinus tachycardia 12 Lead; Mason-Likar Confirmed by Kristine Royal [phone]) on 07/31/2020 3:35:20 PM   Radiology DG Chest 2 View  Result Date: 07/31/2020 CLINICAL DATA:  Shortness of breath EXAM: CHEST - 2 VIEW COMPARISON:  2019 FINDINGS: The heart size and mediastinal contours are within normal limits. Both lungs are clear. No pleural effusion or  pneumothorax. Visualized skeletal structures are unremarkable. IMPRESSION: No acute process in the chest. Electronically Signed   By: Guadlupe Spanish M.D.   On: 07/31/2020 11:09   CT ABDOMEN PELVIS W CONTRAST  Result Date: 07/31/2020 CLINICAL DATA:  Patient states that he has days where he is unable to eat without vomiting. Some mild mid abd pain. EXAM: CT ABDOMEN AND PELVIS WITH CONTRAST TECHNIQUE: Multidetector CT imaging of the abdomen and pelvis was performed using the standard protocol following bolus administration of intravenous contrast. CONTRAST:  OMNIPAQUE IOHEXOL 300 MG/ML  SOLN COMPARISON:  None. FINDINGS: Lower chest: No acute abnormality. Hepatobiliary: No focal liver abnormality is seen. No gallstones, gallbladder wall thickening, or biliary dilatation. Pancreas: Unremarkable. No pancreatic ductal dilatation or surrounding inflammatory changes. Spleen: Normal in size without focal abnormality. Adrenals/Urinary Tract: Adrenal glands are unremarkable. Kidneys are normal, without renal calculi, focal lesion, or hydronephrosis. Bladder is unremarkable. Stomach/Bowel: Stomach is within normal limits. Appendix appears normal. No evidence of bowel wall thickening, distention, or inflammatory changes. Colonic diverticulosis without evidence of diverticulitis. Vascular/Lymphatic: Aortic atherosclerosis. No enlarged abdominal or pelvic lymph nodes. Reproductive: Uterus and bilateral adnexa are unremarkable. Other: No abdominal wall hernia or abnormality. No abdominopelvic ascites. Musculoskeletal: Lower lumbar facet hypertrophy. No acute osseous finding. IMPRESSION: 1. No acute findings in the abdomen or pelvis. 2. Colonic diverticulosis without evidence of diverticulitis. 3. Aortic atherosclerosis. Aortic Atherosclerosis (ICD10-I70.0). Electronically Signed   By: Emmaline Kluver M.D.   On: 07/31/2020 16:39    Procedures Procedures (including critical care time)  Medications Ordered in  ED Medications  sodium chloride 0.9 % bolus 1,000 mL (1,000 mLs Intravenous New Bag/Given 07/31/20 1605)  ondansetron (ZOFRAN) injection 4 mg (4 mg Intravenous Given 07/31/20 1605)    ED Course  I have reviewed the triage vital signs and the nursing notes.  Pertinent labs & imaging results that were available during my care of the patient were reviewed by me and considered in my medical decision making (see chart for details).    MDM Rules/Calculators/A&P                          MDM  Screen complete  Steven Bradshaw was evaluated in Emergency Department on 07/31/2020 for the symptoms described in the history of present illness. He was evaluated in the context of the global COVID-19 pandemic, which necessitated consideration that the patient might be at risk for infection with the SARS-CoV-2 virus that causes COVID-19. Institutional protocols and algorithms that pertain to the evaluation of patients at risk for COVID-19 are in a state of rapid change based on information released by regulatory bodies including the CDC and federal and state organizations. These policies and algorithms were followed during the patient's care in the ED.   Patient presenting for evaluation of nausea, vomiting, and diarrhea.   Workup does not reveal significant acute pathology.  Covid test pending at time of discharge - patient will follow this result via Mychart.  Patient's symptoms could be secondary to Covid infection with predominately GI effect.    CT demonstrates diverticulosis but no acute process.   Patient does feel improved following his ED evaluation.  Importance of close FU stressed. Strict return precautions given and understood.    Final Clinical Impression(s) / ED Diagnoses Final diagnoses:  Nausea vomiting and diarrhea    Rx / DC Orders ED Discharge Orders         Ordered    ondansetron (ZOFRAN) 4 MG tablet  Every 6 hours        07/31/20 1829           Wynetta Fines,  MD 07/31/20 [phone]

## 2020-07-31 NOTE — ED Triage Notes (Signed)
Pt c/o headache, abd pains,  Body aches, n/vd/d with fevers for a week.  Reports since got out of prison last year hasnt had money to see doctors or get prescriptions for his medical issues.  Pt adds being SOB all the time esp with exertion.

## 2020-07-31 NOTE — Discharge Instructions (Addendum)
Please return for any problem.  Follow up with a regular care provider.   Drink plenty of fluids.   Covid test result from today available via Mychart.

## 2020-10-21 ENCOUNTER — Encounter: Payer: Self-pay | Admitting: Clinical

## 2020-10-21 ENCOUNTER — Encounter: Payer: Self-pay | Admitting: Internal Medicine

## 2020-10-21 ENCOUNTER — Ambulatory Visit: Payer: Medicaid Other | Admitting: Internal Medicine

## 2020-10-21 VITALS — BP 180/90 | HR 104 | Resp 28 | Ht 68.0 in | Wt 211.0 lb

## 2020-10-21 DIAGNOSIS — Z6832 Body mass index (BMI) 32.0-32.9, adult: Secondary | ICD-10-CM

## 2020-10-21 DIAGNOSIS — E66811 Obesity, class 1: Secondary | ICD-10-CM | POA: Insufficient documentation

## 2020-10-21 DIAGNOSIS — R739 Hyperglycemia, unspecified: Secondary | ICD-10-CM

## 2020-10-21 DIAGNOSIS — E0590 Thyrotoxicosis, unspecified without thyrotoxic crisis or storm: Secondary | ICD-10-CM

## 2020-10-21 DIAGNOSIS — I10 Essential (primary) hypertension: Secondary | ICD-10-CM

## 2020-10-21 DIAGNOSIS — E669 Obesity, unspecified: Secondary | ICD-10-CM

## 2020-10-21 MED ORDER — METOPROLOL TARTRATE 25 MG PO TABS: 25.0000 mg | ORAL_TABLET | Freq: Two times a day (BID) | ORAL | 11 refills | Status: DC

## 2020-10-21 MED ORDER — METHIMAZOLE 10 MG PO TABS: 10.0000 mg | ORAL_TABLET | Freq: Two times a day (BID) | ORAL | 11 refills | Status: DC

## 2020-10-21 NOTE — Progress Notes (Signed)
Subjective:    Patient ID: Steven Bradshaw, male   DOB: 31-Mar-1976, 44 y.o.   MRN: [id number]   HPI   Pronounce last name "Cussy"  Here to establish and get treated for Hyperthyroidism  1.  Hyperthyroidism:  Diagnosed in 2019.   Feels symptoms date back to 2011:  Was drinking heavily after he and his wife split up.  Was shaking, weight loss, diarrhea, sweating, heat intolerance, irritable, palpitations.   2016 was having chest pain along with above and seen in ED at [facility].  Was told nothing was wrong.  Had myoperfusion stress testing that was negative for ischemic changes.  Echo okay in 2016 and again in 2019  2019 ended up going to prison and was there he was diagnosed and placed on Methimazole.   Was off Methimazole until broke parole and was off med for 2 months in spring of 2020.  Back on in April of 2020 until April of 2021.  He was doing so well then that he stopped the Methimazole.   After discussion, remembers he was on Metoprolol Tartrate twice daily and stopped that in April of 2021 Most recently followed at [facility] in [place].  Now lives in [place].    2.  Hypertension:  A problem prior to diagnosis of Hyperthyroidism in 2008.  3.  Mild hyperglycemia with labs done in August.  No outpatient medications have been marked as taking for the 10/21/20 encounter (Office Visit) with Julieanne Manson, MD.   Allergies  Allergen Reactions  . Bee Venom Anaphylaxis   Past Medical History:  Diagnosis Date  . Alcoholism /alcohol abuse    No alcohol February 01 2019.  Lives at [facility] s[facility] 09/2019.  Marland Kitchen Hypertension 2008  . Hyperthyroidism 2019  . Normal cardiac stress test 04/2015   low risk  . Suicidal ideation     History reviewed. No pertinent surgical history.  Family History  Problem Relation Age of Onset  . Hypothyroidism Mother   . Diabetes Mother   . Diabetes Father   . Diabetes Sister   . Hypothyroidism Sister     Social History   Socioeconomic  History  . Marital status: Divorced    Spouse name: Not on file  . Number of children: 0  . Years of education: 66  . Highest education level: Some college, no degree  Occupational History  . 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 Smoking status: Current Every Day Smoker    Packs/day: 1.00    Types: Cigarettes  . Smokeless tobacco: Never Used  . Tobacco comment: Would like to get stable with thyroid issues before working on this.  Vaping Use  . Vaping Use: Never used  Substance and Sexual Activity  . Alcohol use: Yes    Comment: March 1. 2020 last use  . Drug use: Yes    Types: Cocaine    Comment: last use 2019  . Sexual activity: Not on file  Other Topics Concern  . Not on file  Social History Narrative   Lives at [facility] for alcohol rehab   Social Determinants of Health   Financial Resource Strain:   . Difficulty of Paying Living Expenses: Not on file  Food Insecurity:   . Worried About Programme researcher, broadcasting/film/video in the Last Year: Not on file  . Ran Out of Food in the Last Year: Not on file  Transportation Needs: No Transportation Needs  . Lack of Transportation (Medical): No  .  Lack of Transportation (Non-Medical): No  Physical Activity:   . Days of Exercise per Week: Not on file  . Minutes of Exercise per Session: Not on file  Stress:   . Feeling of Stress : Not on file  Social Connections:   . Frequency of Communication with Friends and Family: Not on file  . Frequency of Social Gatherings with Friends and Family: Not on file  . Attends Religious Services: Not on file  . Active Member of Clubs or Organizations: Not on file  . Attends Banker Meetings: Not on file  . Marital Status: Not on file  Intimate Partner Violence:   . Fear of Current or Ex-Partner: Not on file  . Emotionally Abused: Not on file  . Physically Abused: Not on file  . Sexually Abused: Not on file     Review of Systems    Objective:   BP (!) 180/90 (BP  Location: Left Arm, Patient Position: Sitting, Cuff Size: Normal)   Pulse (!) 104   Resp (!) 28   Ht 5\' 8"  (1.727 m)   Wt 211 lb (95.7 kg)   BMI 32.08 kg/m   Physical Exam  NAD HEENT:  PERRL, EOMI, no exophthalmos, no definite lid lag. Neck:  Supple, No adenopathy.  + Thyromegaly, no thyroid bruit Chest:  CTA CV:  RRR, a bit tachy at times.  Normal S1 and S2, No S3, S4 or murmur.  Carotid, radial and DP pulses normal and equal Abd:  S, NT, No HSM or mass. Neuro:  Fine tremors of hands outstretched.  Brisk ankle reflexes.   Assessment & Plan  1.  Hyperthyroidism:  Send for records from [facility] TSH, FT4 today Methimazole 10 mg twice daily. Metoprolol Follow up in 1 month for TSH  2.  Hypertension:  Metoprolol 25 mg twice daily.  BP and pulses check with TSH in 4 weeks.  3.  Obesity: discussed somewhat today improving diet.  4.  Hyperglycemia with strong family history of DM:  As in #3.  A1C  5.  Tobacco Abuse:  Will address when doing better with thyroid/bp.  6.  History of ETOH abuse:  Doing well since 02/01/2019

## 2020-10-22 ENCOUNTER — Encounter: Payer: Self-pay | Admitting: Internal Medicine

## 2020-10-22 LAB — HGB A1C W/O EAG: Hgb A1c MFr Bld: 4.9 % (ref 4.8–5.6)

## 2020-10-22 LAB — T4, FREE: Free T4: 5.7 ng/dL — ABNORMAL HIGH (ref 0.82–1.77)

## 2020-10-22 LAB — TSH: TSH: <0.005 u[IU]/mL — ABNORMAL LOW (ref 0.450–4.500)

## 2020-10-26 NOTE — Progress Notes (Signed)
Social worker met with new patient who is scheduled with Dr. Delrae Alfred for medical visit. Social worker completed New Patient Questionnaire which included completion of housing, intimate partner violence, transportation needs, stress, Physicist, medical strain, food insecurity and screeners. 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  Vaping Use   Vaping Use: Never used  Substance and Sexual Activity   Alcohol use: Yes    Comment: March 1. 2020 last use   Drug use: Yes    Types: Cocaine    Comment: last use 2019   Sexual activity: Not on file  Other Topics Concern   Not on file  Social History Narrative   Lives at [facility] for alcohol rehab   Social Determinants of Health   Financial Resource Strain: Low Risk    Difficulty of Paying Living Expenses: Not hard at all  Food Insecurity: No Food Insecurity   Worried About Programme researcher, broadcasting/film/video in the Last Year: Never true   Barista in the Last Year: Never true  Transportation Needs: Unmet Transportation Needs   Lack of Transportation (Medical): Yes   Lack of Transportation (Non-Medical): Yes  Physical Activity:    Days of Exercise per Week: Not on file   Minutes of Exercise per Session: Not on file  Stress: Stress Concern Present   Feeling of Stress : To some extent  Social Connections: Socially Isolated   Frequency of Communication with Friends and Family: Once a week   Frequency of Social Gatherings with Friends and Family: Never   Attends Religious Services: Never    Database administrator or Organizations: Yes   Attends Engineer, structural: More than 4 times per year   Marital Status: Divorced    Depression screen [facility] 2/9 10/26/2020  Decreased Interest 0  Down, Depressed, Hopeless 0  PHQ - 2 Score 0  Altered sleeping 3  Tired, decreased energy 3  Change in appetite 3  Feeling bad or failure about yourself  0  Trouble concentrating 0  Moving slowly or fidgety/restless 3  Suicidal thoughts 0  PHQ-9 Score 12    GAD 7 : Generalized Anxiety Score 10/26/2020  Nervous, Anxious, on Edge 3  Control/stop worrying 2  Worry too much - different things 3  Trouble relaxing 3  Restless 2  Easily annoyed or irritable 3  Afraid - awful might happen 2  Total GAD 7 Score 18  Anxiety Difficulty (No Data)   Patient reports symptoms reported in PHQ9 were related to his thyroid condition. Patient reports currently in recovery since February 01, 2019 approximate 20 months. Patient reports a history of 2 treatment rehabs. Reports what helped with his recovery was being in parole, history of DWI's and a event that led him to work towards his recovery. Patient reports currently attending AA and being in a [facility] is assisting him with his recovery. LCSW did screen AUDIT due to his history of alcohol use, reports in the past had 4 or more times  a week, 10 or more on typical day, and daily six or more. It should be noted again, patient is currently in recovery. Patient reports a history of anxiety, currently being in treatment with AA and talking with his sponsor has helped manage his anxiety.    Based on presentation patient recommended to continue his recovery with AA. LCSW informed if he feels he may need extra support in his recovery he is welcome to reach out to LCSW as LCSW is a LCAS-A.

## 2020-11-16 ENCOUNTER — Encounter: Payer: Self-pay | Admitting: Internal Medicine

## 2020-11-16 ENCOUNTER — Ambulatory Visit: Payer: Self-pay | Admitting: Internal Medicine

## 2020-11-16 ENCOUNTER — Other Ambulatory Visit: Payer: Self-pay

## 2020-11-16 VITALS — BP 150/84 | HR 76 | Resp 12

## 2020-11-16 DIAGNOSIS — I10 Essential (primary) hypertension: Secondary | ICD-10-CM

## 2020-11-16 MED ORDER — METOPROLOL TARTRATE 50 MG PO TABS
ORAL_TABLET | ORAL | 11 refills | Status: DC
Start: 2020-11-16 — End: 2021-09-11

## 2020-11-16 NOTE — Progress Notes (Signed)
BP improved, but still too high--moving to 50 mg twice daily with Metoprolol. Discussed if does not feel well with this dosing, to take 1/2 tab in morning and 1 tab in evening.  Discussed was hyperthyroid with labs at visit in November.  He is feeling better on Methimazole. His A1C was well within normal range

## 2020-11-16 NOTE — Progress Notes (Signed)
Patient is taking metoprolol 25 mg daily.

## 2020-12-20 ENCOUNTER — Telehealth: Payer: Self-pay | Admitting: *Deleted

## 2020-12-22 ENCOUNTER — Other Ambulatory Visit: Payer: Medicaid Other

## 2020-12-22 NOTE — Telephone Encounter (Signed)
Patient called requesting refills on his prescription. After reviewing patient's chart, he was informed of additional refills available.

## 2020-12-26 ENCOUNTER — Ambulatory Visit: Payer: Medicaid Other | Admitting: Internal Medicine

## 2020-12-30 ENCOUNTER — Other Ambulatory Visit: Payer: Medicaid Other | Admitting: Internal Medicine

## 2021-01-05 ENCOUNTER — Ambulatory Visit: Payer: Medicaid Other | Admitting: Internal Medicine

## 2021-01-05 ENCOUNTER — Encounter: Payer: Self-pay | Admitting: Internal Medicine

## 2021-01-05 ENCOUNTER — Other Ambulatory Visit: Payer: Self-pay

## 2021-01-05 VITALS — BP 140/98 | HR 61 | Resp 12 | Ht 68.0 in | Wt 236.0 lb

## 2021-01-05 DIAGNOSIS — I10 Essential (primary) hypertension: Secondary | ICD-10-CM

## 2021-01-05 DIAGNOSIS — E0590 Thyrotoxicosis, unspecified without thyrotoxic crisis or storm: Secondary | ICD-10-CM

## 2021-01-05 DIAGNOSIS — E669 Obesity, unspecified: Secondary | ICD-10-CM

## 2021-01-05 NOTE — Progress Notes (Unsigned)
    Subjective:    Patient ID: Steven Bradshaw, male   DOB: 11/04/1976, 45 y.o.   MRN: [id number]   HPI   1.  Hyperthyroidism:  Has gained 25 lbs since November when placed back on Methimazole.  Denies tremors, continues to eat a lot, though has tried to cut back on snacks.  Was previously eating donuts and honeybuns.    2.  Hypertension:  BP a bit better, but feels very lethargic since increasing Metoprolol to 50 mg twice daily Describes a terrible diet--lots of soda and carbs/high saturated fat foods.  3.  Obesity:  As above.  Current Meds  Medication Sig  . methimazole (TAPAZOLE) 10 MG tablet Take 1 tablet (10 mg total) by mouth 2 (two) times daily.  . metoprolol tartrate (LOPRESSOR) 50 MG tablet 1 tab by mouth twice daily   Allergies  Allergen Reactions  . Bee Venom Anaphylaxis     Review of Systems    Objective:   BP (!) 140/98 (BP Location: Left Arm, Patient Position: Sitting, Cuff Size: Normal)   Pulse 61   Resp 12   Ht 5\' 8"  (1.727 m)   Wt 236 lb (107 kg)   BMI 35.88 kg/m   Physical Exam   Assessment & Plan   Hold on decreasing beta blocker and adding another bp med until thyroid testing back

## 2021-01-05 NOTE — Patient Instructions (Signed)
Make goals on every other week basis and get started:  Need to look at all the bread, soda, high sugar items you are eating.

## 2021-01-06 LAB — TSH: TSH: 1.57 u[IU]/mL (ref 0.450–4.500)

## 2021-01-06 LAB — T4, FREE: Free T4: 0.94 ng/dL (ref 0.82–1.77)

## 2021-01-18 ENCOUNTER — Telehealth: Payer: Self-pay | Admitting: Internal Medicine

## 2021-01-18 MED ORDER — AMLODIPINE BESYLATE 5 MG PO TABS: 5.0000 mg | ORAL_TABLET | Freq: Every day | ORAL | 11 refills | Status: DC

## 2021-01-18 NOTE — Progress Notes (Signed)
Called patient but his telephone is currently without service.

## 2021-01-18 NOTE — Telephone Encounter (Signed)
  Called patient but phone number is currently without service.

## 2021-01-23 NOTE — Telephone Encounter (Signed)
Patient's phone continues to be disconnected. An email was sent to the patient asking to call back for lab results and Doctor instructions.

## 2021-02-15 NOTE — Telephone Encounter (Signed)
Patient called and verbally expressed his understanding of new Rx and Doctor recommendations. Phone number was updated on his chart.

## 2021-09-11 ENCOUNTER — Encounter: Payer: Self-pay | Admitting: Internal Medicine

## 2021-09-11 ENCOUNTER — Ambulatory Visit: Payer: Self-pay | Admitting: Internal Medicine

## 2021-09-11 ENCOUNTER — Other Ambulatory Visit: Payer: Self-pay

## 2021-09-11 VITALS — BP 156/88 | HR 76 | Resp 20 | Ht 68.0 in | Wt 262.0 lb

## 2021-09-11 DIAGNOSIS — M5442 Lumbago with sciatica, left side: Secondary | ICD-10-CM

## 2021-09-11 DIAGNOSIS — E0590 Thyrotoxicosis, unspecified without thyrotoxic crisis or storm: Secondary | ICD-10-CM

## 2021-09-11 DIAGNOSIS — Z23 Encounter for immunization: Secondary | ICD-10-CM

## 2021-09-11 DIAGNOSIS — I10 Essential (primary) hypertension: Secondary | ICD-10-CM

## 2021-09-11 DIAGNOSIS — Z6839 Body mass index (BMI) 39.0-39.9, adult: Secondary | ICD-10-CM

## 2021-09-11 DIAGNOSIS — R739 Hyperglycemia, unspecified: Secondary | ICD-10-CM

## 2021-09-11 MED ORDER — METHIMAZOLE 10 MG PO TABS: 10.0000 mg | ORAL_TABLET | Freq: Two times a day (BID) | ORAL | 11 refills | Status: AC

## 2021-09-11 MED ORDER — LISINOPRIL 10 MG PO TABS: 10.0000 mg | ORAL_TABLET | Freq: Every day | ORAL | 11 refills | Status: AC

## 2021-09-11 NOTE — Progress Notes (Signed)
Subjective:    Patient ID: Steven Bradshaw, male   DOB: 12/13/1975, 45 y.o.   MRN: [id number]   HPI  Pronounce last name "Steven Bradshaw"   Primary hypertension:  stopped taking meds--Felt metoprolol was causing him to be too fatigued and had what he states were muscle cramps behind his right ear with the amlodipine, which resolved after he stopped taking.  Has been on Cardizem in past for BP/thyroid as well.    2.  Obesity:  has picked up 50 lbs since November of 2021.  Thyroid has been controlled, so has not been in a hyperthyroid state.   Was able to discontinue Pepsi after discussion in February for 2 months, but then went back.  Was drinking low sugar sweet tea instead.   He has eaten this morning.  Cheese [place].  Breakfast:  Vilma Meckel breakfast bowl and 2 pieces of white toast.  And a pepsi Lunch at 1 to 2 p.m.:  vienna sausages and honey bun.  Soda Dinner:  Vilma Meckel breakfast bowl. Or fried chicken, potatoes, veggies.   Living at [facility] based.  On [address].  Has lived there for 2 years this month.  Can stay there as long as he wants.  Lives there to stay away from alcohol.   Has not had formal counseling.  Does not think it would be helpful.  Describes anxiety with work assignments or with sports in the past.   Would like to work out, but work out areas do not take cash.    3.  Left low back pain:  Started after scooping too heavy of soil when shoveling dirt about 1 month ago.  Has taken ibuprofen and using heating pad.  Helps temporarily.  Does have radiation down back of left leg to knee.  Pain comes and goes with work, bending over, etc.  4.  HM:  Refuses influenza vaccine.  Has not had COVID bivalent vaccination.  Current Meds  Medication Sig   methimazole (TAPAZOLE) 10 MG tablet Take 1 tablet (10 mg total) by mouth 2 (two) times daily.   Allergies  Allergen Reactions   Bee Venom Anaphylaxis     Review of Systems    Objective:   BP (!) 156/88   Pulse  76   Resp 20   Ht 5\' 8"  (1.727 m)   Wt 262 lb (118.8 kg)   BMI 39.84 kg/m   Physical Exam NAD HEENT:  No exopthalmos or lid lag.  PERRL, EOMI Neck:  Supple, no adenopathy, thyroid symmetrically enlarged. Chest:  CTA CV:  RRR with normal S1 and S2, No S3, S4 or murmur.  Carotid, radial and DP pulses normal and equal Abd:  protuberant.  No HSM or mass, + BS, NT LE:  No edema Back:  NT over spinous processes and paraspinous musculature.  Negative straight leg raise.   Neuro:  LE:  Motor 5/5, DTRs 2+/4 throughout.  Gait normal. ( No pain currently)   Assessment & Plan    Hypertension:  discussion regarding goals for eating better and weight loss.  Not clear how much he will be able to accomplish with current living arrangement.  Asked to see if he can get his housemates to go along for the ride with better eating.  Also, to see if can get in at [facility] for workouts. Lisinopril 10 mg daily with CBC, CMP today and repeat BMP with nurse visit for bp check in 1 week.  Follow up with me in  2 months.  2.  Left low back pain with possible radiculopathy:  he has no way to PT in [facility].  Does not bother at night.  Ibuprofen 600-800 mg with food twice daily for 14 days.  Back stretches given.  3.  Obesity:  as in #1.  Encouraged starting with sugary drinks and then the honey bun.  Concerned for 50 lb weight gain.  FLP/A1C  4.  Hyperthyroidism:  TSH, free T4.  Would like to consider radioactive iodine treatment.  Signing up for orange card and will see if can do this with [facility] once he is signed up.  5.  HM:  accepting only one vaccine today:  COVID bivalent with [employer]

## 2021-09-11 NOTE — Patient Instructions (Addendum)
Drink a glass of water before every meal Drink 6-8 glasses of water daily Eat three meals daily Eat a protein and healthy fat with every meal (eggs,fish, chicken, [place] and limit red meats) Eat 5 servings of vegetables daily, mix the colors Eat 2 servings of fruit daily with skin, if skin is edible Use smaller plates Put food/utensils down as you chew and swallow each bite Eat at a table with friends/family at least once daily, no TV Do not eat in front of the TV  Recent studies show that people who consume all of their calories in a 12 hour period lose weight more efficiently.  For example, if you eat your first meal at 7:00 a.m., your last meal of the day should be completed by 7:00 p.m.   Low back stretches twice daily  Ibuprofen 600-800 mg twice daily with food for 14 days, then as needed thereafter.

## 2021-09-12 LAB — LIPID PANEL W/O CHOL/HDL RATIO
Cholesterol, Total: 158 mg/dL (ref 100–199)
HDL: 35 mg/dL — ABNORMAL LOW (ref >39)
LDL Chol Calc (NIH): 104 mg/dL — ABNORMAL HIGH (ref 0–99)
Triglycerides: 105 mg/dL (ref 0–149)
VLDL Cholesterol Cal: 19 mg/dL (ref 5–40)

## 2021-09-12 LAB — CBC WITH DIFFERENTIAL/PLATELET
Basophils Absolute: 0.1 10*3/uL (ref 0.0–0.2)
Basos: 1 %
EOS (ABSOLUTE): 0.4 10*3/uL (ref 0.0–0.4)
Eos: 5 %
Hematocrit: 46.9 % (ref 37.5–51.0)
Hemoglobin: 16.4 g/dL (ref 13.0–17.7)
Immature Grans (Abs): 0 10*3/uL (ref 0.0–0.1)
Immature Granulocytes: 0 %
Lymphocytes Absolute: 2.8 10*3/uL (ref 0.7–3.1)
Lymphs: 32 %
MCH: 32 pg (ref 26.6–33.0)
MCHC: 35 g/dL (ref 31.5–35.7)
MCV: 91 fL (ref 79–97)
Monocytes Absolute: 0.8 10*3/uL (ref 0.1–0.9)
Monocytes: 9 %
Neutrophils Absolute: 4.9 10*3/uL (ref 1.4–7.0)
Neutrophils: 53 %
Platelets: 282 10*3/uL (ref 150–450)
RBC: 5.13 x10E6/uL (ref 4.14–5.80)
RDW: 12.9 % (ref 11.6–15.4)
WBC: 8.9 10*3/uL (ref 3.4–10.8)

## 2021-09-12 LAB — COMPREHENSIVE METABOLIC PANEL
ALT: 24 IU/L (ref 0–44)
AST: 16 IU/L (ref 0–40)
Albumin/Globulin Ratio: 1.8 (ref 1.2–2.2)
Albumin: 4.6 g/dL (ref 4.0–5.0)
Alkaline Phosphatase: 117 IU/L (ref 44–121)
BUN/Creatinine Ratio: 10 (ref 9–20)
BUN: 10 mg/dL (ref 6–24)
Bilirubin Total: 0.4 mg/dL (ref 0.0–1.2)
CO2: 23 mmol/L (ref 20–29)
Calcium: 9.3 mg/dL (ref 8.7–10.2)
Chloride: 105 mmol/L (ref 96–106)
Creatinine, Ser: 0.99 mg/dL (ref 0.76–1.27)
Globulin, Total: 2.5 g/dL (ref 1.5–4.5)
Glucose: 86 mg/dL (ref 70–99)
Potassium: 4.2 mmol/L (ref 3.5–5.2)
Sodium: 140 mmol/L (ref 134–144)
Total Protein: 7.1 g/dL (ref 6.0–8.5)
eGFR: 95 mL/min/{1.73_m2} (ref >59)

## 2021-09-12 LAB — HGB A1C W/O EAG: Hgb A1c MFr Bld: 4.9 % (ref 4.8–5.6)

## 2021-09-12 LAB — TSH: TSH: 1.78 u[IU]/mL (ref 0.450–4.500)

## 2021-09-12 LAB — T4, FREE: Free T4: 0.94 ng/dL (ref 0.82–1.77)

## 2021-09-19 ENCOUNTER — Other Ambulatory Visit: Payer: Medicaid Other

## 2021-10-06 ENCOUNTER — Other Ambulatory Visit: Payer: Medicaid Other

## 2021-11-13 ENCOUNTER — Ambulatory Visit: Payer: Medicaid Other | Admitting: Internal Medicine

## 2021-11-22 ENCOUNTER — Other Ambulatory Visit: Payer: Self-pay

## 2021-11-22 ENCOUNTER — Emergency Department (HOSPITAL_COMMUNITY): Payer: Self-pay

## 2021-11-22 ENCOUNTER — Emergency Department (HOSPITAL_COMMUNITY)
Admission: EM | Admit: 2021-11-22 | Discharge: 2021-11-23 | Disposition: A | Discharge status: Home / Self Care | Payer: Self-pay | Attending: Emergency Medicine | Admitting: Emergency Medicine

## 2021-11-22 ENCOUNTER — Encounter (HOSPITAL_COMMUNITY): Payer: Self-pay

## 2021-11-22 DIAGNOSIS — F17210 Nicotine dependence, cigarettes, uncomplicated: Secondary | ICD-10-CM | POA: Insufficient documentation

## 2021-11-22 DIAGNOSIS — Z79899 Other long term (current) drug therapy: Secondary | ICD-10-CM | POA: Insufficient documentation

## 2021-11-22 DIAGNOSIS — I10 Essential (primary) hypertension: Secondary | ICD-10-CM | POA: Insufficient documentation

## 2021-11-22 DIAGNOSIS — R0781 Pleurodynia: Secondary | ICD-10-CM

## 2021-11-22 MED ORDER — IBUPROFEN 800 MG PO TABS
800.0000 mg | ORAL_TABLET | Freq: Once | ORAL | Status: AC
  Administered 2021-11-23: 800 mg via ORAL
  Filled 2021-11-22: qty 1

## 2021-11-22 NOTE — ED Provider Notes (Signed)
[facility]-EMERGENCY DEPT Provider Note   CSN: [id number] Arrival date & time: 11/22/21  2224     History Chief Complaint  Patient presents with   Rib Injury    Steven Bradshaw is a 45 y.o. male.  The history is provided by the patient. No language interpreter was used.   45 year old male significant history of hypertension, alcohol abuse, who presents complaining of rib pain.  Patient report last week he had a cold which symptom is improving.  Today he recall sneezing and developed an acute onset of sharp stabbing pain to his right side of his chest.  Pain is nonradiating, worse with coughing or with movement and is moderate in intensity.  No fever no hemoptysis no shortness of breath unless he takes a deep breath.  No abdominal complaints and no nausea or vomiting.  He does smoke cigarettes approximately a pack a day.  Reported negative home COVID test  Past Medical History:  Diagnosis Date   Alcoholism /alcohol abuse    No alcohol February 01 2019.  Lives at [facility] sinc 09/2019.   Hypertension 2008   Hyperthyroidism 2019   Normal cardiac stress test 04/2015   low risk   Suicidal ideation     Patient Active Problem List   Diagnosis Date Noted   Class 1 obesity with serious comorbidity and body mass index (BMI) of 32.0 to 32.9 in adult 10/21/2020   Hyperglycemia 10/21/2020   Bruit    Atypical chest pain 02/21/2018   Non compliance w medication regimen 02/21/2018   Hyperthyroidism 2019   Allergic reaction 04/14/2015   Primary hypertension 04/14/2015   Chest pain with moderate risk of acute coronary syndrome 04/14/2015   Tobacco abuse 04/14/2015   ETOH abuse 04/14/2015    History reviewed. No pertinent surgical history.     Family History  Problem Relation Age of Onset   Hypothyroidism Mother    Diabetes Mother    Diabetes Father    Diabetes Sister    Hypothyroidism Sister     Social History   Tobacco Use   Smoking status: Every Day     Packs/day: 1.00    Types: Cigarettes   Smokeless tobacco: Never   Tobacco comments:    Would like to get stable with thyroid issues before working on this.  Vaping Use   Vaping Use: Never used  Substance Use Topics   Alcohol use: Yes    Comment: March 1. 2020 last use   Drug use: Yes    Types: Cocaine    Comment: last use 2019    Home Medications Prior to Admission medications   Medication Sig Start Date End Date Taking? Authorizing Provider  lisinopril (ZESTRIL) 10 MG tablet Take 1 tablet (10 mg total) by mouth daily. 09/11/21   Julieanne Manson, MD  methimazole (TAPAZOLE) 10 MG tablet Take 1 tablet (10 mg total) by mouth 2 (two) times daily. 09/11/21   Julieanne Manson, MD    Allergies    Bee venom  Review of Systems   Review of Systems  All other systems reviewed and are negative.  Physical Exam Updated Vital Signs BP (!) 181/107 (BP Location: Right Arm)    Pulse (!) 101    Temp (!) 97.5 F (36.4 C) (Oral)    Resp 16    Ht 5\' 9"  (1.753 m)    Wt 118.8 kg    SpO2 96%    BMI 38.69 kg/m   Physical Exam Vitals and nursing  note reviewed.  Constitutional:      General: He is not in acute distress.    Appearance: He is well-developed.  HENT:     Head: Atraumatic.  Eyes:     Conjunctiva/sclera: Conjunctivae normal.  Cardiovascular:     Rate and Rhythm: Normal rate and regular rhythm.     Pulses: Normal pulses.     Heart sounds: Normal heart sounds.  Pulmonary:     Effort: Pulmonary effort is normal.     Breath sounds: Normal breath sounds.  Chest:     Chest wall: Tenderness (Point tenderness to right lower anterior chest wall on palpation without any crepitus or emphysema or overlying skin changes.  Shallow breathing but lungs otherwise clear) present.  Abdominal:     Palpations: Abdomen is soft.     Tenderness: There is no abdominal tenderness.  Musculoskeletal:     Cervical back: Neck supple.  Skin:    Findings: No rash.  Neurological:     Mental  Status: He is alert. Mental status is at baseline.    ED Results / Procedures / Treatments   Labs (all labs ordered are listed, but only abnormal results are displayed) Labs Reviewed - No data to display  EKG None  Radiology DG Ribs Unilateral W/Chest Right  Result Date: 11/22/2021 CLINICAL DATA:  Pain in anterior ribs, cough. EXAM: RIGHT RIBS AND CHEST - 3+ VIEW COMPARISON:  07/31/2020. FINDINGS: No fracture or other bone lesions are seen involving the ribs. There is no evidence of pneumothorax or pleural effusion. No consolidation, effusion, or pneumothorax. The heart is enlarged. The mediastinal structures are within normal limits. IMPRESSION: 1. No acute displaced rib fracture. 2. No acute cardiopulmonary process. 3. Cardiomegaly. Electronically Signed   By: Thornell Sartorius M.D.   On: 11/22/2021 23:14    Procedures Procedures   Medications Ordered in ED Medications  ibuprofen (ADVIL) tablet 800 mg (has no administration in time range)    ED Course  I have reviewed the triage vital signs and the nursing notes.  Pertinent labs & imaging results that were available during my care of the patient were reviewed by me and considered in my medical decision making (see chart for details).    MDM Rules/Calculators/A&P                         BP (!) 181/107 (BP Location: Right Arm)    Pulse (!) 101    Temp (!) 97.5 F (36.4 C) (Oral)    Resp 16    Ht 5\' 9"  (1.753 m)    Wt 118.8 kg    SpO2 96%    BMI 38.69 kg/m      Final Clinical Impression(s) / ED Diagnoses Final diagnoses:  Rib pain on right side    Rx / DC Orders ED Discharge Orders          Ordered    ibuprofen (ADVIL) 600 MG tablet  Every 6 hours PRN        11/23/21 0007           11:34 PM Patient report he sneezed today and developed cute onset of pain to his right anterior chest wall.  He has a cold last week and did report quite a bit of sneezing and coughing.  His symptoms suggestive of a potential rib injury  versus costochondritis.  X-ray obtained and without any acute displaced rib fracture or acute cardiopulmonary process.  He does not have  any abdominal pain to suggest biliary disease.  Symptoms not consistent with PE or ACS.  Suspect potential rib injury not picked up on x-ray, will provide NSAIDs, incentive spirometer, and return precaution.   Fayrene Helper, PA-C 11/23/21 0009    Rolan Bucco, MD 11/23/21 [phone]

## 2021-11-22 NOTE — ED Triage Notes (Signed)
Pt reports he sneezed tonight at 8:30pm and there was a sudden onset of right rib cage pain associated with pain with inspiration.

## 2021-11-23 MED ORDER — IBUPROFEN 600 MG PO TABS: 600.0000 mg | ORAL_TABLET | Freq: Four times a day (QID) | ORAL | 0 refills | Status: DC | PRN

## 2021-11-23 NOTE — Discharge Instructions (Addendum)
Your chest pain is likely due to irritation of your right ribs and possible right rib fracture that was not picked up on x-ray.  Please take ibuprofen as needed for pain.  Use a pillow to brace against your chest when you cough or sneeze or with exertion to help provide comfort.  Use incentive spirometer several times daily to ensure that you are taking adequate deep breath to decrease risk of developing lung infection.  Return to the ER if your symptoms worsen, if you coughing up blood or if you have significant shortness of breath

## 2022-03-15 ENCOUNTER — Encounter (HOSPITAL_COMMUNITY): Payer: Self-pay | Admitting: Emergency Medicine

## 2022-03-15 ENCOUNTER — Emergency Department (HOSPITAL_COMMUNITY)
Admission: EM | Admit: 2022-03-15 | Discharge: 2022-03-15 | Disposition: A | Discharge status: Home / Self Care | Payer: Medicaid Other | Attending: Student | Admitting: Student

## 2022-03-15 ENCOUNTER — Other Ambulatory Visit: Payer: Self-pay

## 2022-03-15 DIAGNOSIS — Z79899 Other long term (current) drug therapy: Secondary | ICD-10-CM | POA: Insufficient documentation

## 2022-03-15 DIAGNOSIS — M795 Residual foreign body in soft tissue: Secondary | ICD-10-CM

## 2022-03-15 DIAGNOSIS — I10 Essential (primary) hypertension: Secondary | ICD-10-CM | POA: Insufficient documentation

## 2022-03-15 DIAGNOSIS — L923 Foreign body granuloma of the skin and subcutaneous tissue: Secondary | ICD-10-CM | POA: Insufficient documentation

## 2022-03-15 DIAGNOSIS — Z1833 Retained wood fragments: Secondary | ICD-10-CM | POA: Insufficient documentation

## 2022-03-15 DIAGNOSIS — F17210 Nicotine dependence, cigarettes, uncomplicated: Secondary | ICD-10-CM | POA: Insufficient documentation

## 2022-03-15 MED ORDER — LIDOCAINE HCL (PF) 1 % IJ SOLN
5.0000 mL | Freq: Once | INTRAMUSCULAR | Status: AC
  Administered 2022-03-15: 5 mL via INTRADERMAL
  Filled 2022-03-15: qty 30

## 2022-03-15 NOTE — ED Triage Notes (Signed)
Patient has a large wooden splinter in his left index finger. He was seen at [facility], but they were unable to remove it. He was then sent here.  ?

## 2022-03-15 NOTE — ED Provider Notes (Signed)
?[facility]-EMERGENCY DEPT ?Provider Note ? ?CSN: [id number] ?Arrival date & time: 03/15/22 1430 ? ?Chief Complaint(s) ?Foreign Body in Skin ? ?HPI ?Steven Bradshaw is a 46 y.o. male who presents the emergency department for evaluation of a splinter.  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  Patient at work when a large piece of wood embedded itself in the soft tissue dorsal aspect of his left index finger.  Denies chest pain, shortness breath, abdominal pain or nausea, vomiting, numbness, tingling, weakness or other systemic or neurologic complaints. ? ?HPI ? ?Past Medical History ?Past Medical History:  ?Diagnosis Date  ? Alcoholism /alcohol abuse   ? No alcohol February 01 2019.  Lives at [facility]inc 09/2019.  ? Hypertension 2008  ? Hyperthyroidism 2019  ? Normal cardiac stress test 04/2015  ? low risk  ? Suicidal ideation   ? ?Patient Active Problem List  ? Diagnosis Date Noted  ? Class 1 obesity with serious comorbidity and body mass index (BMI) of 32.0 to 32.9 in adult 10/21/2020  ? Hyperglycemia 10/21/2020  ? Bruit   ? Atypical chest pain 02/21/2018  ? Non compliance w medication regimen 02/21/2018  ? Hyperthyroidism 2019  ? Allergic reaction 04/14/2015  ? Primary hypertension 04/14/2015  ? Chest pain with moderate risk of acute coronary syndrome 04/14/2015  ? Tobacco abuse 04/14/2015  ? ETOH abuse 04/14/2015  ? ?Home Medication(s) ?Prior to Admission medications   ?Medication Sig Start Date End Date Taking? Authorizing Provider  ?ibuprofen (ADVIL) 600 MG tablet Take 1 tablet (600 mg total) by mouth every 6 (six) hours as needed for moderate pain. 11/23/21   Fayrene Helper, PA-C  ?lisinopril (ZESTRIL) 10 MG tablet Take 1 tablet (10 mg total) by mouth daily. 09/11/21   Julieanne Manson, MD  ?methimazole (TAPAZOLE) 10 MG tablet Take 1 tablet (10 mg total) by mouth 2 (two) times daily. 09/11/21   Julieanne Manson, MD  ?                                                                                                                                   ?Past Surgical History ?History reviewed. No pertinent surgical history. ?Family History ?Family History  ?Problem Relation Age of Onset  ? Hypothyroidism Mother   ? Diabetes Mother   ? Diabetes Father   ? Diabetes Sister   ? Hypothyroidism Sister   ? ? ?Social History ?Social History  ? ?Tobacco Use  ? Smoking status: Every Day  ?  Packs/day: 1.00  ?  Types: Cigarettes  ? Smokeless tobacco: Never  ? Tobacco comments:  ?  Would like to get stable with thyroid issues before working on this.  ?Vaping Use  ? Vaping Use: Never used  ?Substance Use Topics  ? Alcohol use: Yes  ?  Comment: March 1. 2020 last use  ? Drug use: Yes  ?  Types: Cocaine  ?  Comment: last use 2019  ? ?Allergies ?Bee venom ? ?Review of Systems ?Review of Systems  ?Skin:  Positive for wound.  ? ?Physical Exam ?Vital Signs  ?I have reviewed the triage vital signs ?BP (!) 175/116 (BP Location: Left Arm)   Pulse (!) 105   Temp 98 ?F (36.7 ?C) (Oral)   Resp 18   SpO2 96%  ? ?Physical Exam ?Vitals and nursing note reviewed.  ?Constitutional:   ?   General: He is not in acute distress. ?   Appearance: He is well-developed.  ?HENT:  ?   Head: Normocephalic and atraumatic.  ?Eyes:  ?   Conjunctiva/sclera: Conjunctivae normal.  ?Cardiovascular:  ?   Rate and Rhythm: Normal rate and regular rhythm.  ?   Heart sounds: No murmur heard. ?Pulmonary:  ?   Effort: Pulmonary effort is normal. No respiratory distress.  ?   Breath sounds: Normal breath sounds.  ?Abdominal:  ?   Palpations: Abdomen is soft.  ?   Tenderness: There is no abdominal tenderness.  ?Musculoskeletal:     ?   General: No swelling.  ?   Cervical back: Neck supple.  ?Skin: ?   General: Skin is warm and dry.  ?   Capillary Refill: Capillary refill takes less than 2 seconds.  ?   Comments: 2 cm wooden splinter on the palmar proximal aspect of the second left finger  ?Neurological:  ?   Mental Status: He is alert.   ?Psychiatric:     ?   Mood and Affect: Mood normal.  ? ? ?ED Results and Treatments ?Labs ?(all labs ordered are listed, but only abnormal results are displayed) ?Labs Reviewed - No data to display                                                                                                                       ? ?Radiology ?No results found. ? ?Pertinent labs & imaging results that were available during my care of the patient were reviewed by me and considered in my medical decision making (see MDM for details). ? ?Medications Ordered in ED ?Medications  ?lidocaine (PF) (XYLOCAINE) 1 % injection 5 mL (5 mLs Intradermal Given by Other 03/15/22 1523)  ?                                                               ?                                                                    ?  Procedures ?Marland KitchenForeign Body Removal ? ?Date/Time: 03/15/2022 3:26 PM ?Performed by: Glendora Score, MD ?Authorized by: Glendora Score, MD  ?Consent: Verbal consent obtained. ?Intake: finger. ?Anesthesia: digital block ? ?Anesthesia: ?Local Anesthetic: lidocaine 1% without epinephrine ? ?Sedation: ?Patient sedated: no ? ?Patient restrained: no ?Patient cooperative: yes ?Complexity: simple ?1 objects recovered. ?Objects recovered: Splinter ?Post-procedure assessment: foreign body removed ? ? ?(including critical care time) ? ?Medical Decision Making / ED Course ? ? ?This patient presents to the ED for concern of foreign body in finger, this involves an extensive number of treatment options, and is a complaint that carries with it a high risk of complications and morbidity.  The differential diagnosis includes splinter, multiple foreign bodies, fracture ? ?MDM: ?Patient seen emergency room for evaluation of a splinter removal.  Physical exam reveals a 2 cm wooden splinter on the palmar aspect of the left second digit, proximal finger pad.  Digital block performed and the splinter was removed at bedside.  Tetanus up-to-date.  Patient then  discharged with outpatient follow-up. ? ? ? ?Medicines ordered and prescription drug management: ?Meds ordered this encounter  ?Medications  ? lidocaine (PF) (XYLOCAINE) 1 % injection 5 mL  ?  ?-I have reviewed the patients home medicines and have made adjustments as needed ? ?Critical interventions ?none ? ? ?Cardiac Monitoring: ?The patient was maintained on a cardiac monitor.  I personally viewed and interpreted the cardiac monitored which showed an underlying rhythm of: NSR ? ?Social Determinants of Health:  ?Factors impacting patients care include: none ? ? ?Reevaluation: ?After the interventions noted above, I reevaluated the patient and found that they have :improved ? ?Co morbidities that complicate the patient evaluation ? ?Past Medical History:  ?Diagnosis Date  ? Alcoholism /alcohol abuse   ? No alcohol February 01 2019.  Lives at [facility] s[facility] 09/2019.  ? Hypertension 2008  ? Hyperthyroidism 2019  ? Normal cardiac stress test 04/2015  ? low risk  ? Suicidal ideation   ?  ? ? ?Dispostion: ?I considered admission for this patient, but splinter removed successfully and patient safe for discharge with outpatient follow-up ? ? ? ? ?Final Clinical Impression(s) / ED Diagnoses ?Final diagnoses:  ?Foreign body (FB) in soft tissue  ? ? ? ?@PCDICTATION @ ? ?  ? , MD ?03/15/22 1528 ? ?

## 2022-07-02 ENCOUNTER — Telehealth: Payer: Self-pay | Admitting: Internal Medicine

## 2022-07-04 NOTE — Telephone Encounter (Signed)
Called patient twice, no answer. Left voice message asking for a call back to schedule a follow up appt.

## 2022-09-03 ENCOUNTER — Ambulatory Visit: Payer: Medicaid Other | Admitting: Internal Medicine

## 2022-09-28 IMAGING — CR DG RIBS W/ CHEST 3+V*R*
3 series · 3 of 3 positions shown · non-contrast
Comparison: 07/31/2020.

CLINICAL DATA: Pain in anterior ribs, cough.

EXAM:
RIGHT RIBS AND CHEST - 3+ VIEW

[w chest pa]
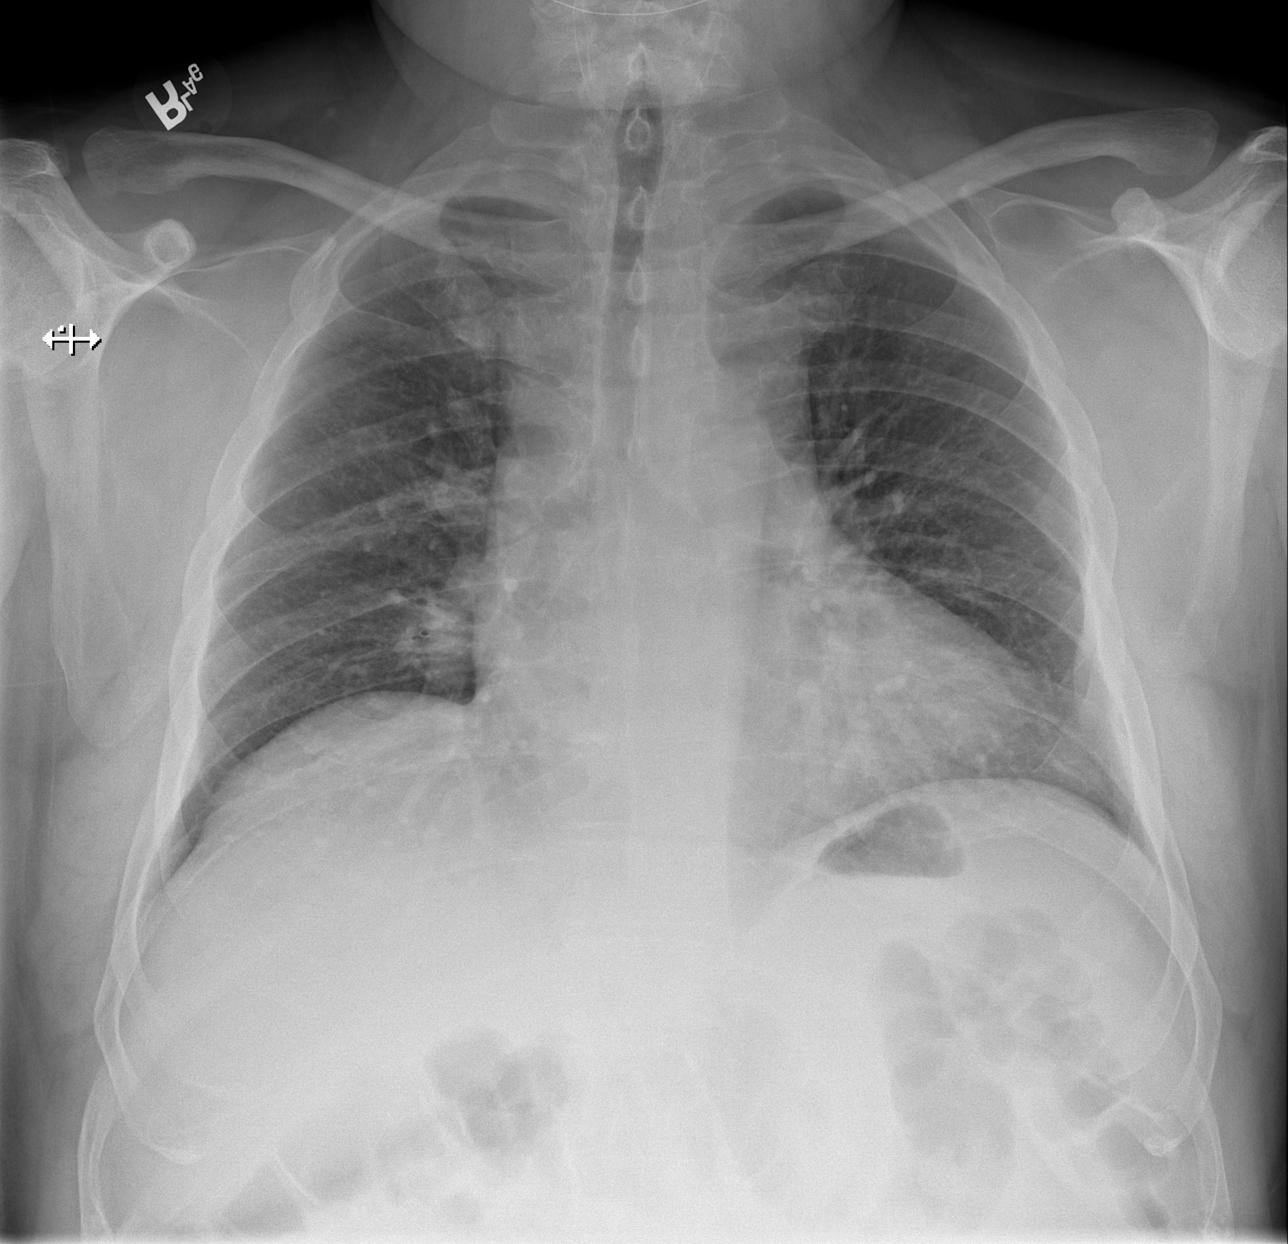

[w ribs obl right (1 of 2)]
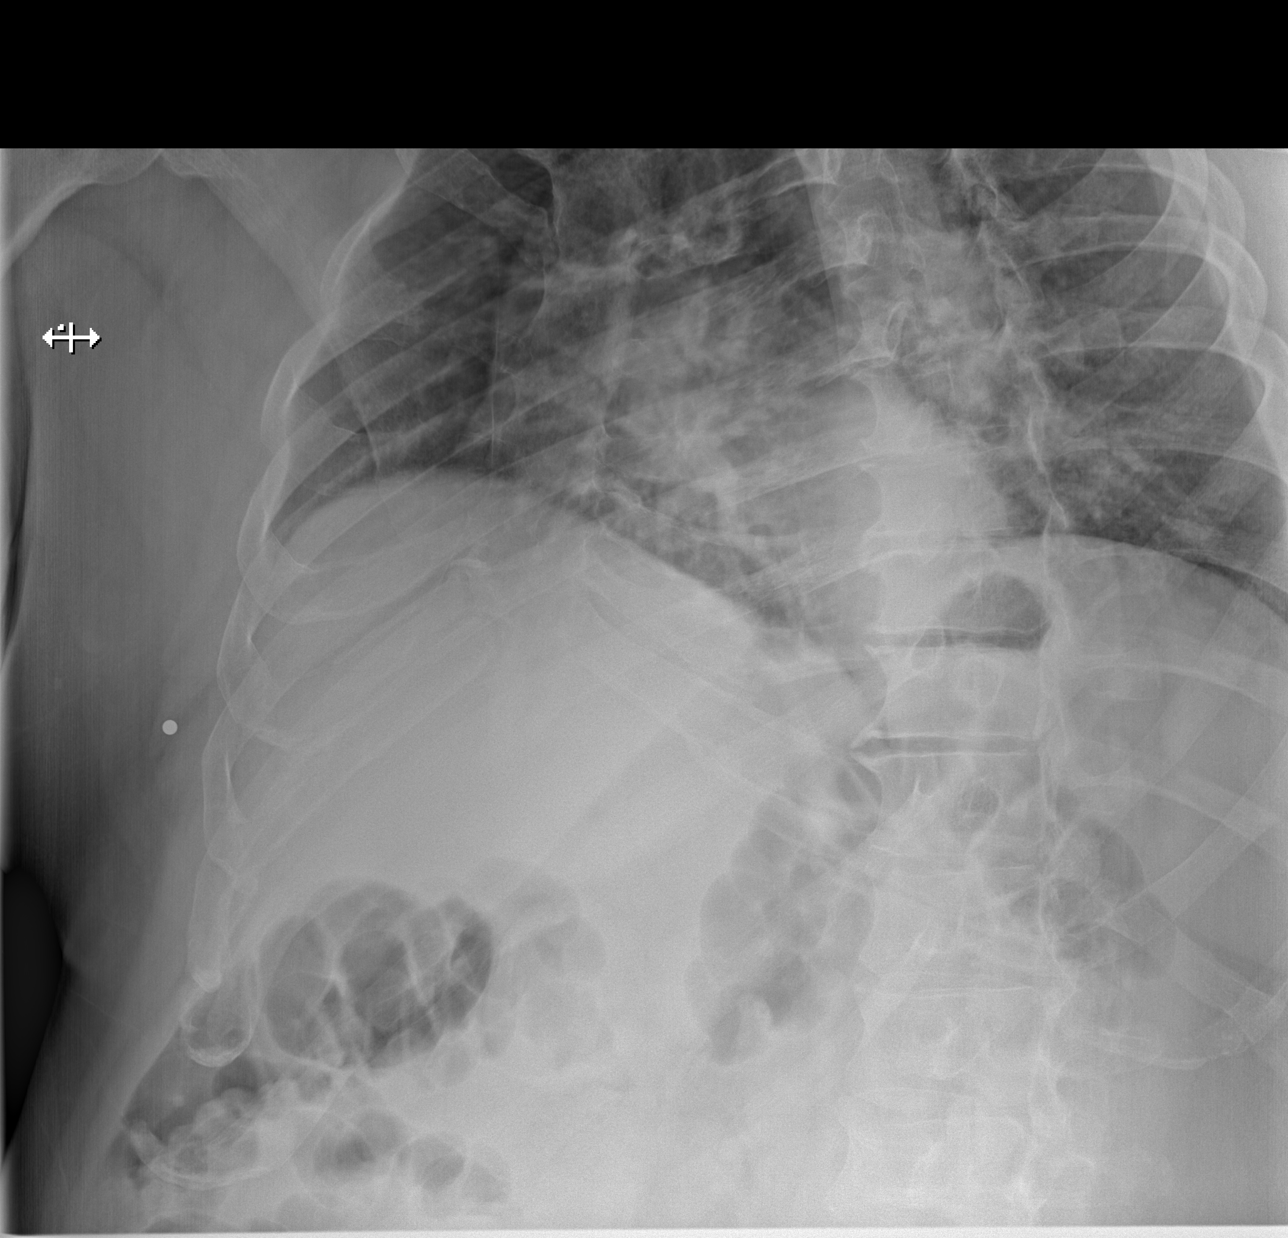

[w ribs obl right (2 of 2)]
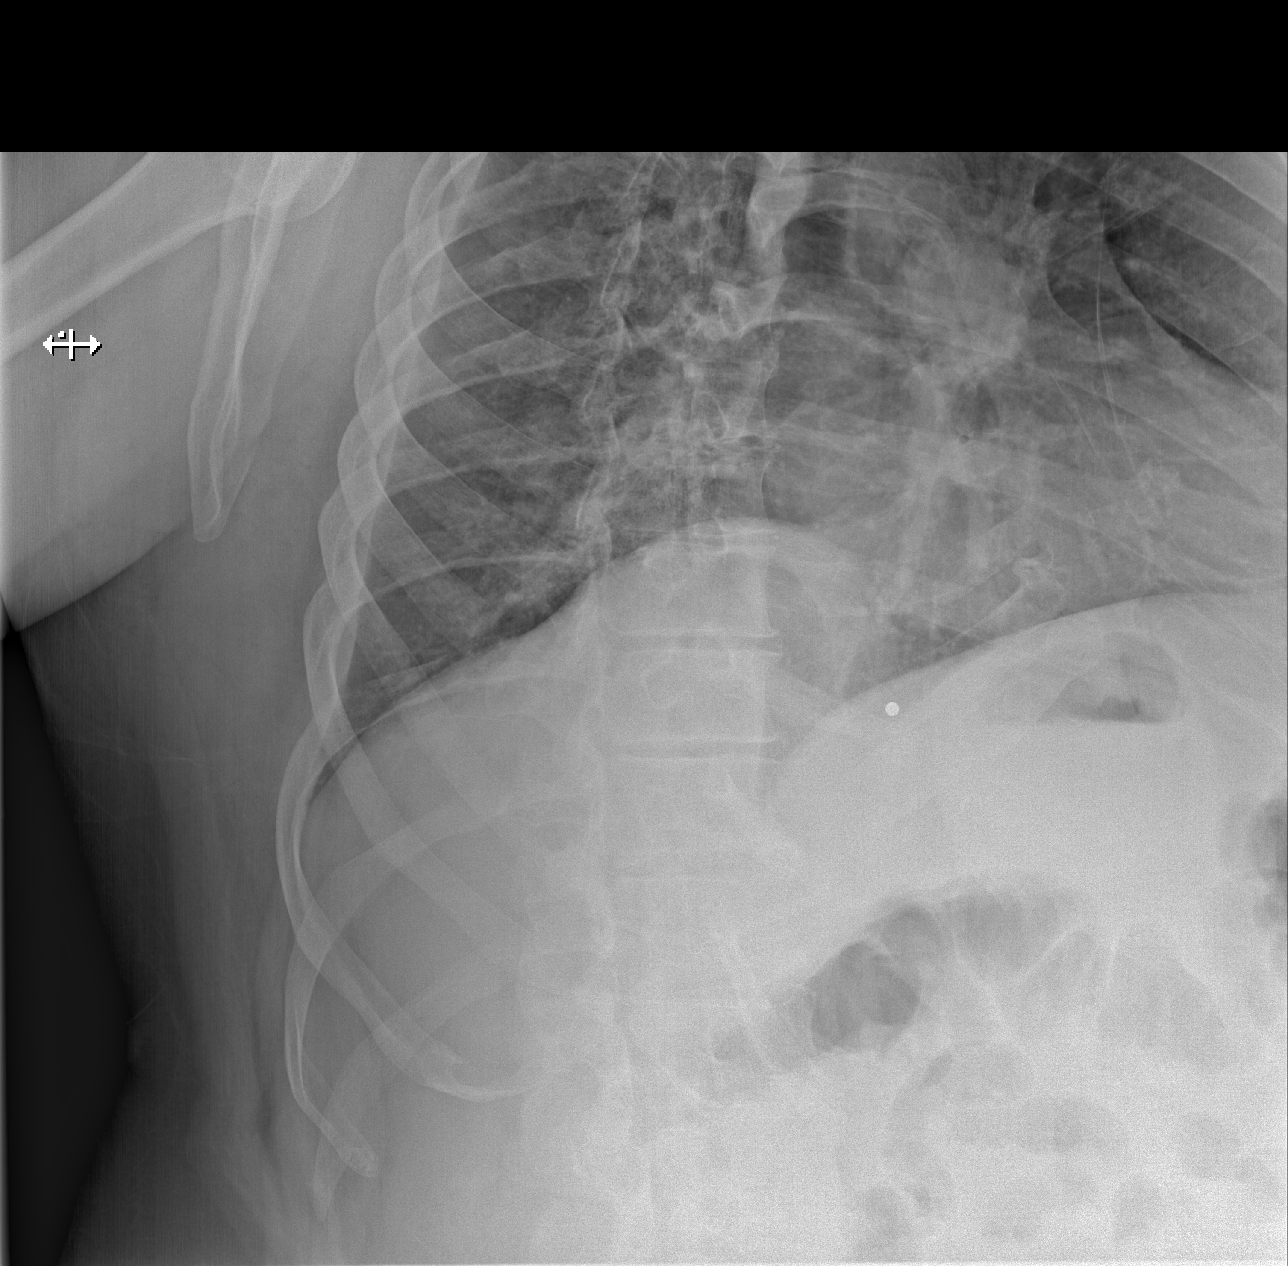

[3 of 3 positions shown; findings below may reference images not displayed]

FINDINGS: No fracture or other bone lesions are seen involving the ribs. There
is no evidence of pneumothorax or pleural effusion. No
consolidation, effusion, or pneumothorax. The heart is enlarged. The
mediastinal structures are within normal limits.
IMPRESSION: 1. No acute displaced rib fracture.
2. No acute cardiopulmonary process.
3. Cardiomegaly.

## 2022-10-10 ENCOUNTER — Ambulatory Visit: Payer: Medicaid Other | Admitting: Internal Medicine

## 2022-11-05 ENCOUNTER — Emergency Department (HOSPITAL_COMMUNITY)
Admission: EM | Admit: 2022-11-05 | Discharge: 2022-11-05 | Disposition: A | Discharge status: Home / Self Care | Payer: Medicaid Other | Attending: Emergency Medicine | Admitting: Emergency Medicine

## 2022-11-05 ENCOUNTER — Other Ambulatory Visit: Payer: Self-pay

## 2022-11-05 ENCOUNTER — Emergency Department (HOSPITAL_BASED_OUTPATIENT_CLINIC_OR_DEPARTMENT_OTHER): Payer: Medicaid Other

## 2022-11-05 ENCOUNTER — Emergency Department (HOSPITAL_COMMUNITY): Payer: Medicaid Other

## 2022-11-05 DIAGNOSIS — I10 Essential (primary) hypertension: Secondary | ICD-10-CM | POA: Diagnosis not present

## 2022-11-05 DIAGNOSIS — R609 Edema, unspecified: Secondary | ICD-10-CM | POA: Diagnosis not present

## 2022-11-05 DIAGNOSIS — M79672 Pain in left foot: Secondary | ICD-10-CM | POA: Diagnosis not present

## 2022-11-05 DIAGNOSIS — M7989 Other specified soft tissue disorders: Secondary | ICD-10-CM | POA: Diagnosis not present

## 2022-11-05 DIAGNOSIS — M79662 Pain in left lower leg: Secondary | ICD-10-CM | POA: Diagnosis not present

## 2022-11-05 DIAGNOSIS — Z79899 Other long term (current) drug therapy: Secondary | ICD-10-CM | POA: Diagnosis not present

## 2022-11-05 MED ORDER — KETOROLAC TROMETHAMINE 30 MG/ML IJ SOLN
30.0000 mg | Freq: Once | INTRAMUSCULAR | Status: DC
  Filled 2022-11-05: qty 1

## 2022-11-05 MED ORDER — KETOROLAC TROMETHAMINE 30 MG/ML IJ SOLN: 30.0000 mg | Freq: Once | INTRAMUSCULAR | Status: DC

## 2022-11-05 NOTE — ED Provider Triage Note (Signed)
Emergency Medicine Provider Triage Evaluation Note  Steven Bradshaw , a 46 y.o. male  was evaluated in triage.  Pt complains of leg pain.  Started on Friday.  Denies trauma.  Located in the left leg.  Notes that his left foot was swollen and he was having trouble putting on slipper.  Still able to bear weight and ambulate.  Endorses calf tenderness as well.  States that left leg is slightly more pale than the right.  Denies shortness of breath and chest pain.  Review of Systems  Positive: See above Negative: See above  Physical Exam  BP (!) 165/97 (BP Location: Left Arm)   Pulse 84   Temp 97.8 F (36.6 C) (Oral)   Resp 18   SpO2 99%  Gen:   Awake, no distress   Resp:  Normal effort  MSK:   Moves extremities without difficulty  Other:  Left calf exquisitely tender, DP pulse 2+ bilaterally range of motion limited in the toes, normal otherwise in the left leg  Medical Decision Making  Medically screening exam initiated at 8:08 AM.  Appropriate orders placed.  Steven Bradshaw was informed that the remainder of the evaluation will be completed by another provider, this initial triage assessment does not replace that evaluation, and the importance of remaining in the ED until their evaluation is complete.  Work up started   Steven Bradshaw, [place] 11/05/22 [phone]

## 2022-11-05 NOTE — Progress Notes (Signed)
Lower extremity venous duplex has been completed.   Preliminary results in CV Proc.   Dovid Bartko Meghana Tullo 11/05/2022 9:15 AM

## 2022-11-05 NOTE — ED Triage Notes (Signed)
Pt reports left foot pain that started Friday morning. Pt reporting it "hurts from my left big to all the way to my calf."

## 2022-11-05 NOTE — Discharge Instructions (Signed)
Evaluation for your lower left leg pain was overall reassuring.  Ultrasound was negative for venous clot in your leg.  CP for ongoing leg pain.  In the meantime recommend Tylenol and ibuprofen as needed for pain.  Also conservative treatment which includes rest, ice, compression and elevation of that lower left leg.  If you have new shortness of breath, chest pain, or your leg becomes pale, cool and numb please return to the emergency department for further evaluation.

## 2022-11-05 NOTE — ED Provider Notes (Signed)
[facility]-EMERGENCY DEPT Provider Note   CSN: [id number] Arrival date & time: 11/05/22  0746     History  Chief Complaint  Patient presents with   Foot Pain   HPI Steven Bradshaw is a 46 y.o. male with HTN, ETOH and tobacco abuse, and hyperthyroidism presenting for leg pain.  Started on Friday.  Denies trauma.  Located in the left leg.  Noticed the left foot was swollen as he difficulty putting on his shoe.  Still able to bear weight and ambulate.  Endorses calf tenderness as well.  States that left leg is slightly more pale than the right.  Denies shortness of breath and chest pain.    Foot Pain       Home Medications Prior to Admission medications   Medication Sig Start Date End Date Taking? Authorizing Provider  ibuprofen (ADVIL) 600 MG tablet Take 1 tablet (600 mg total) by mouth every 6 (six) hours as needed for moderate pain. 11/23/21   Fayrene Helper, PA-C  lisinopril (ZESTRIL) 10 MG tablet Take 1 tablet (10 mg total) by mouth daily. 09/11/21   Julieanne Manson, MD  methimazole (TAPAZOLE) 10 MG tablet Take 1 tablet (10 mg total) by mouth 2 (two) times daily. 09/11/21   Julieanne Manson, MD      Allergies    Bee venom    Review of Systems   Review of Systems  Musculoskeletal:        Left calf tenderness and foot pain    Physical Exam Updated Vital Signs BP (!) 165/97 (BP Location: Left Arm)   Pulse 84   Temp 97.8 F (36.6 C) (Oral)   Resp 18   SpO2 99%  Physical Exam Constitutional:      Appearance: Normal appearance.  HENT:     Head: Normocephalic.     Nose: Nose normal.  Eyes:     Conjunctiva/sclera: Conjunctivae normal.  Cardiovascular:     Pulses:          Dorsalis pedis pulses are 2+ on the left side.  Pulmonary:     Effort: Pulmonary effort is normal.  Musculoskeletal:     Left lower leg: Tenderness present. No swelling. No edema.  Feet:     Comments: Decreased ROM in the left toes. Patient stated too painful to  move. Able to ambulate and bear weight.  Skin:    Capillary Refill: Capillary refill takes less than 2 seconds.  Neurological:     Mental Status: He is alert.  Psychiatric:        Mood and Affect: Mood normal.     ED Results / Procedures / Treatments   Labs (all labs ordered are listed, but only abnormal results are displayed) Labs Reviewed - No data to display  EKG None  Radiology VAS [place] LOWER EXTREMITY VENOUS (DVT) (7a-7p)  Result Date: 11/05/2022  Lower Venous DVT Study Patient Name:  Steven Bradshaw  Date of Exam:   11/05/2022 Medical Rec #: [id number]         Accession #:    [id number] Date of Birth: [birth year]         Patient Gender: M Patient Age:   73 years Exam Location:  [facility] Procedure:      VAS [place] LOWER EXTREMITY VENOUS (DVT) Referring Phys: Riki Sheer --------------------------------------------------------------------------------  Indications: Swelling, and Edema.  Comparison Study: no prior Performing Technologist: [place] Ponder RVS  Examination Guidelines: A complete evaluation includes B-mode imaging, spectral Doppler, color Doppler, and  power Doppler as needed of all accessible portions of each vessel. Bilateral testing is considered an integral part of a complete examination. Limited examinations for reoccurring indications may be performed as noted. The reflux portion of the exam is performed with the patient in reverse Trendelenburg.  +-----+---------------+---------+-----------+----------+--------------+ RIGHTCompressibilityPhasicitySpontaneityPropertiesThrombus Aging +-----+---------------+---------+-----------+----------+--------------+ CFV  Full           Yes      Yes                                 +-----+---------------+---------+-----------+----------+--------------+   +---------+---------------+---------+-----------+----------+--------------+ LEFT     CompressibilityPhasicitySpontaneityPropertiesThrombus Aging  +---------+---------------+---------+-----------+----------+--------------+ CFV      Full           Yes      Yes                                 +---------+---------------+---------+-----------+----------+--------------+ SFJ      Full                                                        +---------+---------------+---------+-----------+----------+--------------+ FV Prox  Full                                                        +---------+---------------+---------+-----------+----------+--------------+ FV Mid   Full                                                        +---------+---------------+---------+-----------+----------+--------------+ FV DistalFull                                                        +---------+---------------+---------+-----------+----------+--------------+ PFV      Full                                                        +---------+---------------+---------+-----------+----------+--------------+ POP      Full           Yes      Yes                                 +---------+---------------+---------+-----------+----------+--------------+ PTV      Full                                                        +---------+---------------+---------+-----------+----------+--------------+ PERO  Full                                                        +---------+---------------+---------+-----------+----------+--------------+     Summary: RIGHT: - No evidence of common femoral vein obstruction.  LEFT: - There is no evidence of deep vein thrombosis in the lower extremity.  - No cystic structure found in the popliteal fossa.  *See table(s) above for measurements and observations.    Preliminary    DG Foot Complete Left  Result Date: 11/05/2022 CLINICAL DATA:  Left foot pain, no injury. EXAM: LEFT FOOT - COMPLETE 3+ VIEW COMPARISON:  None Available. FINDINGS: Developmental cleft in the head of the fifth proximal phalanx.  No acute or degenerative findings. Calcaneal spurs. IMPRESSION: No findings to explain the patient's pain. Electronically Signed   By: Leanna Battles M.D.   On: 11/05/2022 08:22    Procedures Procedures    Medications Ordered in ED Medications  ketorolac (TORADOL) 30 MG/ML injection 30 mg (has no administration in time range)    ED Course/ Medical Decision Making/ A&P                           Medical Decision Making Amount and/or Complexity of Data Reviewed Radiology: ordered.   Patient presented for left leg pain.  Differential diagnosis for this complaint includes DVT, PAD , injury related to trauma, or soft tissue injury and claudication. On exam, no notable swelling in the left leg. Range of motion in the ankle and toes were limited due to pain.  Otherwise neurovascularly intact left leg.  Considered PAD but unlikely given reassuring pulse, cap refill and and warm lower extremity.  Considered DVT but unlikely.  I reviewed and interpreted lower extremity ultrasound which was negative for DVT. Upon reevaluation patient stated that pain was still present. Treated with Toradol.  Also considered trauma but unlikely given no reported traumatic injury and negative x-ray.  Also no obvious deformities noted on exam. Symptoms could be related to soft tissue injury versus peripheral claudication. Treated pain with Toradol and advised to follow-up with his PCP.  Discussed return precautions.  Also considered PE but unlikely given no chest pain or shortness of breath. Patient nontachycardic on arrival.        Final Clinical Impression(s) / ED Diagnoses Final diagnoses:  Pain in left lower leg    Rx / DC Orders ED Discharge Orders     None         [name], PA-C 11/05/22 1344    Benjiman Core, MD 11/05/22 1504

## 2022-11-14 ENCOUNTER — Ambulatory Visit: Payer: Medicaid Other | Admitting: Internal Medicine

## 2023-02-14 ENCOUNTER — Encounter (HOSPITAL_COMMUNITY): Payer: Self-pay

## 2023-02-14 ENCOUNTER — Emergency Department (HOSPITAL_COMMUNITY)
Admission: EM | Admit: 2023-02-14 | Discharge: 2023-02-14 | Disposition: A | Discharge status: Home / Self Care | Payer: Medicaid Other | Attending: Emergency Medicine | Admitting: Emergency Medicine

## 2023-02-14 DIAGNOSIS — K047 Periapical abscess without sinus: Secondary | ICD-10-CM | POA: Insufficient documentation

## 2023-02-14 DIAGNOSIS — K0889 Other specified disorders of teeth and supporting structures: Secondary | ICD-10-CM | POA: Diagnosis present

## 2023-02-14 DIAGNOSIS — E039 Hypothyroidism, unspecified: Secondary | ICD-10-CM | POA: Insufficient documentation

## 2023-02-14 DIAGNOSIS — Z79899 Other long term (current) drug therapy: Secondary | ICD-10-CM | POA: Insufficient documentation

## 2023-02-14 DIAGNOSIS — I10 Essential (primary) hypertension: Secondary | ICD-10-CM | POA: Insufficient documentation

## 2023-02-14 MED ORDER — PENICILLIN V POTASSIUM 500 MG PO TABS: 500.0000 mg | ORAL_TABLET | Freq: Three times a day (TID) | ORAL | 0 refills | Status: AC

## 2023-02-14 MED ORDER — IBUPROFEN 600 MG PO TABS: 600.0000 mg | ORAL_TABLET | Freq: Four times a day (QID) | ORAL | 0 refills | Status: AC | PRN

## 2023-02-14 MED ORDER — AMLODIPINE BESYLATE 5 MG PO TABS: 5.0000 mg | ORAL_TABLET | Freq: Every day | ORAL | 1 refills | Status: AC

## 2023-02-14 NOTE — Discharge Instructions (Signed)
You have been evaluated for your dental pain.  You are likely experiencing a dental abscess.  Please take antibiotic as prescribed but you will need to follow-up closely with a dentist for further managements of your dental infection.  You may use resource below to find a dentist.  Your blood pressure is quite high today.  It is important for you to take blood pressure medication and follow-up with your doctor closely for further management to decrease risk of complication from uncontrolled high blood pressure.

## 2023-02-14 NOTE — ED Provider Notes (Signed)
[place] EMERGENCY DEPARTMENT AT [facility] Provider Note   CSN: [id number] Arrival date & time: 02/14/23  [id number]     History  Chief Complaint  Patient presents with   Dental Pain    Steven Bradshaw is a 47 y.o. male.  The history is provided by the patient and medical records. No language interpreter was used.  Dental Pain    47 year old male with significant history of hypertension, alcohol abuse, hypothyroidism presenting complaining of dental pain.  Patient reports since yesterday he developed dental pain.  Pain is primarily to his left lower molar.  He has had this pain in the past but this morning he woke up noticing both pain and swelling to his face.  Described pain as a throbbing sensation worse with cold temperature and with chewing.  No associate fever no trouble swallowing and no neck pain.  Currently rates pain as 6 out of 10, he did take some Advil with some relief.  He does not have a dentist.  Home Medications Prior to Admission medications   Medication Sig Start Date End Date Taking? Authorizing Provider  ibuprofen (ADVIL) 600 MG tablet Take 1 tablet (600 mg total) by mouth every 6 (six) hours as needed for moderate pain. 11/23/21   Domenic Moras, PA-C  lisinopril (ZESTRIL) 10 MG tablet Take 1 tablet (10 mg total) by mouth daily. 09/11/21   Mack Hook, MD  methimazole (TAPAZOLE) 10 MG tablet Take 1 tablet (10 mg total) by mouth 2 (two) times daily. 09/11/21   Mack Hook, MD      Allergies    Bee venom    Review of Systems   Review of Systems  All other systems reviewed and are negative.   Physical Exam Updated Vital Signs BP (!) 190/104 (BP Location: Right Arm)   Pulse 90   Temp 97.8 F (36.6 C) (Oral)   Resp 18   SpO2 98%  Physical Exam Vitals and nursing note reviewed.  Constitutional:      General: He is not in acute distress.    Appearance: He is well-developed.  HENT:     Head: Atraumatic.     Mouth/Throat:      Comments: Mouth: Significant dental decay noted to tooth #7 with tenderness to palpation.  Adjacent facial swelling noted as well but no obvious abscess amenable for drainage.  No trismus. Eyes:     Conjunctiva/sclera: Conjunctivae normal.  Musculoskeletal:     Cervical back: Neck supple. No rigidity or tenderness.  Skin:    Findings: No rash.  Neurological:     Mental Status: He is alert.     ED Results / Procedures / Treatments   Labs (all labs ordered are listed, but only abnormal results are displayed) Labs Reviewed - No data to display  EKG None  Radiology No results found.  Procedures Procedures    Medications Ordered in ED Medications - No data to display  ED Course/ Medical Decision Making/ A&P                             Medical Decision Making  BP (!) 190/104 (BP Location: Right Arm)   Pulse 90   Temp 97.8 F (36.6 C) (Oral)   Resp 18   SpO2 98%   37:69 AM 47 year old male with significant history of hypertension, alcohol abuse, hypothyroidism presenting complaining of dental pain.  Patient reports since yesterday he developed dental pain.  Pain  is primarily to his left lower molar.  He has had this pain in the past but this morning he woke up noticing both pain and swelling to his face.  Described pain as a throbbing sensation worse with cold temperature and with chewing.  No associate fever no trouble swallowing and no neck pain.  Currently rates pain as 6 out of 10, he did take some Advil with some relief.  He does not have a dentist.  On exam, patient is sitting in the chair appears to be in no acute discomfort.  He does have notable left-sided facial swelling from his dental infection.  Mouth exam remarkable for dental decay noted to tooth #17 which is likely the source of his infection.  No abscess amenable for drainage.  Neck otherwise supple.  No skin erythema suggestive of parotitis or cellulitis.  Vital sign review and notable for blood pressure  over 190/104.  Patient admits he has history of hypertension but states he does not take blood pressure medication because he does not like the way it feels.  Right show encourage patient to monitor his blood pressure closely and to take blood pressure medication to decrease risk of complication from elevated blood pressure.  Will prescribe amlodipine encourage patient to follow-up outpatient.  He does not exhibit any symptoms concerning for hypertensive emergency.  Plan to also prescribe patient recently with antibiotic and give dental referral.  DDx: Dental infection, periapical abscess with facial involvement, cellulitis, parotitis, abscess.  Social determinant health which includes a social isolation, tobacco use, depression, lack of transportation.        Final Clinical Impression(s) / ED Diagnoses Final diagnoses:  Periapical abscess with facial involvement  Uncontrolled hypertension    Rx / DC Orders ED Discharge Orders          Ordered    ibuprofen (ADVIL) 600 MG tablet  Every 6 hours PRN        02/14/23 1014    penicillin v potassium (VEETID) 500 MG tablet  3 times daily        02/14/23 1014    amLODipine (NORVASC) 5 MG tablet  Daily        02/14/23 1014              Domenic Moras, PA-C 02/14/23 1016    Blanchie Dessert, MD 02/18/23 2113

## 2023-02-14 NOTE — ED Triage Notes (Signed)
Pt presents with c/o left side dental pain that started last night. Pt has some facial swelling along the lower left side of his jaw.

## 2023-12-20 ENCOUNTER — Other Ambulatory Visit: Payer: Self-pay | Admitting: Internal Medicine

## 2023-12-27 NOTE — Telephone Encounter (Signed)
Lvm asking to return call

## 2024-01-20 NOTE — Telephone Encounter (Signed)
Lvm asking to return call 01/20/2024.

## 2024-01-31 NOTE — Telephone Encounter (Signed)
 Called patient, patient did not answer and unable to lvm 01/31/2024.

## 2024-03-27 ENCOUNTER — Encounter: Payer: Self-pay | Admitting: Internal Medicine

## 2024-03-27 NOTE — Telephone Encounter (Signed)
 Attempted to call patient, patient did not answer. Sent out a letter via mail 03/27/24.
